# Patient Record
Sex: Female | Born: 1965 | Race: Black or African American | Hispanic: No | Smoking: Current every day smoker
Health system: Southern US, Community
[De-identification: ages and names within clinical notes are randomized; demographics above are authoritative.]

## PROBLEM LIST (undated history)

## (undated) DIAGNOSIS — F1010 Alcohol abuse, uncomplicated: Secondary | ICD-10-CM

## (undated) DIAGNOSIS — K219 Gastro-esophageal reflux disease without esophagitis: Secondary | ICD-10-CM

## (undated) DIAGNOSIS — F32A Depression, unspecified: Secondary | ICD-10-CM

## (undated) DIAGNOSIS — Z5900 Homelessness unspecified: Secondary | ICD-10-CM

## (undated) DIAGNOSIS — M109 Gout, unspecified: Secondary | ICD-10-CM

## (undated) DIAGNOSIS — I10 Essential (primary) hypertension: Secondary | ICD-10-CM

---

## 2001-10-16 ENCOUNTER — Emergency Department (HOSPITAL_COMMUNITY): Admission: EM | Admit: 2001-10-16 | Discharge: 2001-10-16 | Payer: Self-pay | Admitting: Emergency Medicine

## 2002-11-05 ENCOUNTER — Ambulatory Visit (HOSPITAL_COMMUNITY): Admission: RE | Admit: 2002-11-05 | Discharge: 2002-11-05 | Payer: Self-pay | Admitting: *Deleted

## 2002-11-05 ENCOUNTER — Encounter: Payer: Self-pay | Admitting: *Deleted

## 2002-12-21 ENCOUNTER — Inpatient Hospital Stay (HOSPITAL_COMMUNITY): Admission: AD | Admit: 2002-12-21 | Discharge: 2002-12-21 | Payer: Self-pay | Admitting: *Deleted

## 2003-02-09 ENCOUNTER — Inpatient Hospital Stay (HOSPITAL_COMMUNITY): Admission: AD | Admit: 2003-02-09 | Discharge: 2003-02-09 | Payer: Self-pay | Admitting: Obstetrics and Gynecology

## 2003-04-12 ENCOUNTER — Inpatient Hospital Stay (HOSPITAL_COMMUNITY): Admission: AD | Admit: 2003-04-12 | Discharge: 2003-04-12 | Payer: Self-pay | Admitting: *Deleted

## 2003-04-14 ENCOUNTER — Encounter (INDEPENDENT_AMBULATORY_CARE_PROVIDER_SITE_OTHER): Payer: Self-pay | Admitting: Specialist

## 2003-04-14 ENCOUNTER — Inpatient Hospital Stay (HOSPITAL_COMMUNITY): Admission: AD | Admit: 2003-04-14 | Discharge: 2003-04-16 | Payer: Self-pay | Admitting: Obstetrics & Gynecology

## 2003-04-19 ENCOUNTER — Inpatient Hospital Stay (HOSPITAL_COMMUNITY): Admission: AD | Admit: 2003-04-19 | Discharge: 2003-04-19 | Payer: Self-pay | Admitting: *Deleted

## 2005-04-24 ENCOUNTER — Emergency Department (HOSPITAL_COMMUNITY): Admission: EM | Admit: 2005-04-24 | Discharge: 2005-04-24 | Payer: Self-pay | Admitting: Emergency Medicine

## 2007-08-23 ENCOUNTER — Emergency Department (HOSPITAL_COMMUNITY): Admission: EM | Admit: 2007-08-23 | Discharge: 2007-08-23 | Payer: Self-pay | Admitting: Emergency Medicine

## 2009-06-24 ENCOUNTER — Emergency Department (HOSPITAL_COMMUNITY): Admission: EM | Admit: 2009-06-24 | Discharge: 2009-06-24 | Payer: Self-pay | Admitting: Emergency Medicine

## 2009-11-13 ENCOUNTER — Emergency Department (HOSPITAL_COMMUNITY): Admission: EM | Admit: 2009-11-13 | Discharge: 2009-11-13 | Payer: Self-pay | Admitting: Emergency Medicine

## 2010-12-06 LAB — URINALYSIS, ROUTINE W REFLEX MICROSCOPIC
Bilirubin Urine: NEGATIVE
Glucose, UA: NEGATIVE mg/dL
Hgb urine dipstick: NEGATIVE
Ketones, ur: NEGATIVE mg/dL
Nitrite: NEGATIVE
Protein, ur: NEGATIVE mg/dL
Specific Gravity, Urine: 1.022 (ref 1.005–1.030)
Urobilinogen, UA: 0.2 mg/dL (ref 0.0–1.0)
pH: 5.5 (ref 5.0–8.0)

## 2011-02-01 NOTE — Discharge Summary (Signed)
   NAME:  Amy Conway, Amy Conway NO.:  [id number]   MEDICAL RECORD NO.:  [id number]                   PATIENT TYPE:  INP   LOCATION:  9106                                 FACILITY:  [facility]   PHYSICIAN:  Burnadette Peter, M.D.             DATE OF BIRTH:  16-Jun-1966   DATE OF ADMISSION:  04/14/2003  DATE OF DISCHARGE:  04/16/2003                                 DISCHARGE SUMMARY   PRIMARY PHYSICIAN:  [facility].   DISCHARGE DIAGNOSES:  1. Status post repeat low transverse cesarean section.  2. Status post delivery of a viable female infant.   DISCHARGE MEDICATIONS:  1. Percocet 5/325 one to two tablets p.o. q.4-6h. p.r.n. severe pain.  2. Ibuprofen 600 mg p.o. q.6h. p.r.n. pain.  3. Prenatal vitamins one p.o. daily x6 weeks.  4. Iron sulfate 325 mg p.o. t.i.d. x6 weeks.  5. Alesse starting on Sunday, May 01, 2003; take one tablet p.o. daily.   FOLLOW-UP:  The patient is to follow up at either the GYN clinic in four  weeks if she desires a tubal ligation - the patient is to call to schedule  an appointment, or if she decides not to have a tubal ligation she is to  follow up at [facility] in six weeks.  Additionally the patient should  follow up at maternity admissions on Monday or Tuesday of this week for  staple removal.   ADMISSION HISTORY AND PHYSICAL:  A 45 year old G2 now P2 now admitted for  repeat low transverse C-section.  The patient did have LGA with gestational  diabetes.   HOSPITAL COURSE:  The patient tolerated the C-section without difficulty and  had a routine postpartum course and postoperative course.  She developed no  difficulties throughout her hospitalization and desired discharge on  postoperative day #2.   DISCHARGE LABORATORY DATA:  Wbc 8.6, hemoglobin 8.9, platelets 146, RPR  nonreactive.   Again, the patient is to follow up either at GYN clinic in four weeks or at  [facility] in six weeks.     Jonah Blue,  M.D.                      Burnadette Peter, M.D.    Milas Gain  D:  04/16/2003  T:  04/16/2003  Job:  [id number]   cc:   [facility]

## 2011-02-01 NOTE — Op Note (Signed)
NAME:  Amy Conway, Amy Conway NO.:  [id number]   MEDICAL RECORD NO.:  [id number]                   PATIENT TYPE:  INP   LOCATION:  9106                                 FACILITY:  [facility]   PHYSICIAN:  Clement Husbands, M.D.         DATE OF BIRTH:  1966-06-28   DATE OF PROCEDURE:  04/14/2003  DATE OF DISCHARGE:                                 OPERATIVE REPORT   PREOPERATIVE DIAGNOSIS:  Term pregnancy, repeat cesarean section.   POSTOPERATIVE DIAGNOSIS:  Term pregnancy, repeat cesarean section.   OPERATION:  Repeat low transverse cervical cesarean section.   SURGEON:  Burnadette Peter, M.D.   ANESTHESIA:  Spinal.   PROCEDURE:  With the patient under satisfactory spinal anesthesia in the  supine position and slightly tilted to the left, the urethra was prepped and  a Foley catheter inserted.  The abdomen was prepped and draped.  A low  abdominal transverse skin incision was made with the knife and cutting  cautery, and dissection was carried down through a very thick subcutaneous  tissue layer to the rectus fascia which was then sharply transversely  divided.  The rectus muscles were divided in the midline.  There were marked  adhesions under the upper rectus fascia and this required careful dissection  to free them up.  The peritoneum was then entered.  The bladder blade was  positioned.  The vesicouterine peritoneum was transversely incised and the  bladder pushed inferiorly and the bladder blade was repositioned.  A low  uterine transverse incision was made through an extremely thick myometrium.  I could not widen the incision with manual pulling, so bandage scissors were  utilized on each side to cut through the very thick myometrium.  The  peritoneal cavity was entered.  A very loose loop of nuchal cord was present  lying alongside the baby's head.  The vertex was lifted into the incision at  which  time the soft vacuum was applied.  With gentle  traction, the head was  easily delivered followed by the rest of the baby.  Spontaneous respirations  and crying were noted.  The cord was doubly clamped and divided.  The infant  was shown to the parents and passed on to the neonatal team that was in  attendance.  A long segment of cord was saved for both pH and routine  studies.  The placenta was adherent posteriorly but after some dissection,  it loosened up and was removed intact.  The uterus was explored and no  further tissue was noted to be present.  The internal os was dilated with a  sponge stick.  An intravenous Pitocin drip was started.  Intravenous Ancef  was given.   Although the uterus was involuting well, it was much too large to lift out  of the incision.  The uterine incision was closed with a running locking 0  Vicryl suture.  A second layer  imbricated the first.  There were numerous  bleeding sites.  Many figure-of-eight or single suturing with both 0 Vicryl  and a 2-0 Vicryl on a smaller needle were utilized to finally sustain  hemostasis.  The suture line was irrigated and hemostasis was good.  The  vesicouterine peritoneum was reapproximated with a running 2-0 Vicryl  sutures.  I then put my hand inside to go up to the top of the uterus to  find the fallopian tubes.  This caused the patient to have extreme pain and  she started crying and bearing down which brought small bowel into the  incision.  The small bowel was replaced in the upper abdomen.  I tried one  more time to bring the fundus of the uterus down somewhat and to locate the  tubes but this caused her to have extreme pain.  Because of this, I told the  patient that we would not tie her tubes and she was in complete agreement  on this.   The anterior peritoneum was closed with a running 0 Vicryl suture.  The area  underneath the fascia was inspected and there was no bleeding noted.  The  rectus muscles in the lower midline were put together with  interrupted 0  Vicryl sutures.  The rectus fascia was closed with two segments of running 0  Vicryl sutures, each begun laterally and brought to the midline where they  were tied separately.  The subcutaneous tissue layer was irrigated.  It was  inspected and there was no bleeding noted.  The skin edges were  reapproximated with wide skin staples.  Estimated blood loss 1000 mL.  Urine  output 120 mL.  Fluid intake 3500 mL.  Sponge and needle counts were  correct.  The patient tolerated the procedure well and was returned to the  recovery room in satisfactory condition.   She was delivered of a female infant who weighed 7 pounds.  The Apgar was 9  and 9.  The arterial cord pH was 7.3.                                               Clement Husbands, M.D.    EFR/MEDQ  D:  04/14/2003  T:  04/14/2003  Job:  [id number]

## 2011-09-13 ENCOUNTER — Emergency Department (HOSPITAL_COMMUNITY): Payer: Medicaid Other

## 2011-09-13 ENCOUNTER — Encounter: Payer: Self-pay | Admitting: Emergency Medicine

## 2011-09-13 ENCOUNTER — Emergency Department (HOSPITAL_COMMUNITY)
Admission: EM | Admit: 2011-09-13 | Discharge: 2011-09-13 | Disposition: A | Discharge status: Home / Self Care | Payer: Medicaid Other | Attending: Emergency Medicine | Admitting: Emergency Medicine

## 2011-09-13 DIAGNOSIS — F458 Other somatoform disorders: Secondary | ICD-10-CM | POA: Insufficient documentation

## 2011-09-13 DIAGNOSIS — R0989 Other specified symptoms and signs involving the circulatory and respiratory systems: Secondary | ICD-10-CM

## 2011-09-13 DIAGNOSIS — R6889 Other general symptoms and signs: Secondary | ICD-10-CM | POA: Insufficient documentation

## 2011-09-13 MED ORDER — GI COCKTAIL ~~LOC~~
30.0000 mL | Freq: Once | ORAL | Status: AC
  Administered 2011-09-13: 30 mL via ORAL
  Filled 2011-09-13: qty 30

## 2011-09-13 NOTE — ED Notes (Signed)
Pt to ED with having difficulty swallowing a piece of steak. Pt states that she got choked and felt like the steak would not pass through. Pt states the steak does not feel like its still stuck but she is having pain from where the steak was stuck

## 2011-09-13 NOTE — ED Notes (Signed)
ION:[id number]<MW> Expected date:09/13/11<BR> Expected time: 7:50 PM<BR> Means of arrival:Ambulance<BR> Comments:<BR> M251 - 45yoF Got choked up on food, no distress now

## 2011-09-13 NOTE — ED Provider Notes (Signed)
History     CSN: [id number]  Arrival date & time 09/13/11  4098   First MD Initiated Contact with Patient 09/13/11 2040      Chief Complaint  Patient presents with  . Sore Throat    after having some difficulty swallowing a piece of steak.      HPI  History provided by the patient. Patient is a 45 year old female with no significant past medical history who presents with complaints of feeling pressure and tightness in throat after swallowing a piece of steak earlier today. Patient states that she felt like the steak was stuck in her throat and was having some difficulty breathing. At this time patient states she is breathing fine. He has not had any nausea or vomiting. She denies any coughing. She states she still feels a pressure in her throat. Patient denies any similar symptoms previously. Patient has no other significant past medical history.     Past Medical History  Diagnosis Date  . No pertinent past medical history     Past Surgical History  Procedure Date  . Cesarean section     No family history on file.  History  Substance Use Topics  . Smoking status: Not on file  . Smokeless tobacco: Not on file  . Alcohol Use: Yes     2 40 oz weekly    OB History    Grav Para Term Preterm Abortions TAB SAB Ect Mult Living                  Review of Systems  Respiratory: Negative for cough.   Gastrointestinal: Negative for nausea, vomiting and abdominal pain.  All other systems reviewed and are negative.    Allergies  Penicillins  Home Medications   Current Outpatient Rx  Name Route Sig Dispense Refill  . POLYETHYL GLYCOL-PROPYL GLYCOL 0.4-0.3 % OP SOLN Both Eyes Place 2 drops into both eyes 2 (two) times daily.        BP 134/83  Pulse 97  Temp(Src) 98.1 F (36.7 C) (Oral)  Resp 22  SpO2 99%  LMP 08/29/2011  Physical Exam  Nursing note and vitals reviewed. Constitutional: She is oriented to person, place, and time. She appears well-developed and  well-nourished. No distress.  HENT:  Head: Normocephalic.  Mouth/Throat: Oropharynx is clear and moist.  Neck: Normal range of motion. Neck supple.       No crepitus or mass.  Cardiovascular: Normal rate and regular rhythm.   Pulmonary/Chest: Effort normal and breath sounds normal. No stridor. No respiratory distress. She has no rales. She exhibits no tenderness.  Abdominal: Soft. She exhibits no distension. There is no tenderness. There is no rebound and no guarding.  Lymphadenopathy:    She has no cervical adenopathy.  Neurological: She is alert and oriented to person, place, and time.  Skin: Skin is warm and dry. No rash noted.  Psychiatric: She has a normal mood and affect. Her behavior is normal.    ED Course  Procedures (including critical care time)  Labs Reviewed - No data to display Dg Chest 2 View  09/13/2011  *RADIOLOGY REPORT*  Clinical Data: Rule out foreign body from swallowing  CHEST - 2 VIEW  Comparison: None  Findings: Cardiomediastinal silhouette is unremarkable.  No acute infiltrate or pleural effusion.  No radiopaque foreign body is identified.  No pulmonary edema.  Mild degenerative changes mid thoracic spine.  IMPRESSION: No active disease.  No radiopaque foreign body is identified.  Original Report  Authenticated By: Natasha Mead, M.D.     1. Globus sensation       MDM  8:50 PM patient seen and evaluated. Patient in no acute distress. Patient handling secretions well.   Patient given several by mouth challenges with no difficulty in swallowing. Patient continues to have normal respirations and good O2 saturations. There is no stridor or abnormal lung sounds on exam. Patient's chest x-ray appears normal. At this time will discharge patient home with referral for GI specialist.     Angus Seller, PA 09/14/11 [id number]

## 2011-09-13 NOTE — ED Notes (Signed)
Pt given discharge instructions and verbalizes understanding  

## 2011-09-14 NOTE — ED Provider Notes (Signed)
Medical screening examination/treatment/procedure(s) were performed by non-physician practitioner and as supervising physician I was immediately available for consultation/collaboration.  Jullia Mulligan R. Quanisha Drewry, MD 09/14/11 1443 

## 2012-05-20 ENCOUNTER — Emergency Department (HOSPITAL_COMMUNITY)
Admission: EM | Admit: 2012-05-20 | Discharge: 2012-05-21 | Disposition: A | Discharge status: Home / Self Care | Payer: Medicaid Other | Attending: Emergency Medicine | Admitting: Emergency Medicine

## 2012-05-20 ENCOUNTER — Encounter (HOSPITAL_COMMUNITY): Payer: Self-pay | Admitting: Emergency Medicine

## 2012-05-20 DIAGNOSIS — N644 Mastodynia: Secondary | ICD-10-CM | POA: Insufficient documentation

## 2012-05-20 DIAGNOSIS — Z880 Allergy status to penicillin: Secondary | ICD-10-CM | POA: Insufficient documentation

## 2012-05-20 NOTE — ED Notes (Signed)
Per EMS, Pt from home with rt side breast pain.  Sharp pain radiating down right arm.  Onset 2 months.  Vitals:  170/100, pulse 104.  No visible bruising.  Tenderness noted.  No Hx, No meds reported.  Allergies to PCN.

## 2012-05-21 ENCOUNTER — Emergency Department (HOSPITAL_COMMUNITY): Payer: Medicaid Other

## 2012-05-21 MED ORDER — OXYCODONE-ACETAMINOPHEN 5-325 MG PO TABS
1.0000 | ORAL_TABLET | Freq: Once | ORAL | Status: AC
  Administered 2012-05-21: 1 via ORAL
  Filled 2012-05-21: qty 1

## 2012-05-21 MED ORDER — IBUPROFEN 800 MG PO TABS
800.0000 mg | ORAL_TABLET | Freq: Once | ORAL | Status: AC
  Administered 2012-05-21: 800 mg via ORAL
  Filled 2012-05-21: qty 1

## 2012-05-21 MED ORDER — IBUPROFEN 800 MG PO TABS: 800.0000 mg | ORAL_TABLET | Freq: Four times a day (QID) | ORAL | Status: DC | PRN

## 2012-05-21 NOTE — ED Provider Notes (Signed)
Medical screening examination/treatment/procedure(s) were performed by non-physician practitioner and as supervising physician I was immediately available for consultation/collaboration.  Olivia Mackie, MD 05/21/12 2137

## 2012-05-21 NOTE — ED Provider Notes (Signed)
History     CSN: [id number]  Arrival date & time 05/20/12  2100   First MD Initiated Contact with Patient 05/21/12 0214      Chief Complaint  Patient presents with  . Breast Pain    (Consider location/radiation/quality/duration/timing/severity/associated sxs/prior treatment) The history is provided by the patient and the spouse. No language interpreter was used.   Cc: 46 yo female.Patient reports R breast pain x 1 month.  States that it is a throbbing pain.  States that air conditioning makes it worse. No birthcontrol pills. No cough, SOB ,  No long trips or calf pain. LMP 04/29/12.   Smoker.  Has taken one goody powder for pain with minor relief.  Denies family hx of breast cancer.  States she does self exams and no detectible lumps.    Past Medical History  Diagnosis Date  . No pertinent past medical history     Past Surgical History  Procedure Date  . Cesarean section     No family history on file.  History  Substance Use Topics  . Smoking status: Not on file  . Smokeless tobacco: Not on file  . Alcohol Use: Yes     2 40 oz weekly    OB History    Grav Para Term Preterm Abortions TAB SAB Ect Mult Living                  Review of Systems  Constitutional: Negative.   HENT: Negative.   Eyes: Negative.   Respiratory: Negative.   Cardiovascular: Negative.        R breast pain  Gastrointestinal: Negative.  Negative for nausea and vomiting.  Genitourinary: Negative for menstrual problem.  Musculoskeletal: Negative for back pain.  Neurological: Negative.   Psychiatric/Behavioral: Negative.   All other systems reviewed and are negative.    Allergies  Penicillins  Home Medications   Current Outpatient Rx  Name Route Sig Dispense Refill  . ADULT MULTIVITAMIN W/MINERALS CH Oral Take 1 tablet by mouth daily.      BP 132/68  Pulse 71  Temp 98.2 F (36.8 C) (Oral)  Resp 20  Wt 172 lb (78.019 kg)  SpO2 100%  Physical Exam  Nursing note and vitals  reviewed. Constitutional: She is oriented to person, place, and time. She appears well-developed and well-nourished.  HENT:  Head: Normocephalic and atraumatic.  Eyes: Conjunctivae and EOM are normal. Pupils are equal, round, and reactive to light.  Neck: Normal range of motion. Neck supple.  Cardiovascular: Normal rate.   Pulmonary/Chest: Effort normal and breath sounds normal. No respiratory distress. She exhibits tenderness.       R breast cool to touch  Abdominal: Soft. Bowel sounds are normal.  Musculoskeletal: Normal range of motion. She exhibits no edema and no tenderness.  Neurological: She is alert and oriented to person, place, and time. She has normal reflexes.  Skin: Skin is warm and dry.  Psychiatric: She has a normal mood and affect.    ED Course  Procedures (including critical care time) Breast exam with no palpable lumps.  Chest x-ray unremarkable. Labs Reviewed - No data to display No results found.   No diagnosis found.    MDM  R breast tenderness.  Chest x-ray unremarkable.  No palpable lumps or lymphedema  She will [facility] and get [facility] scheduled this week.  Return to ER for severe pain, fever.  Remi Haggard, NP 05/21/12 1658

## 2012-05-23 ENCOUNTER — Emergency Department (HOSPITAL_COMMUNITY)
Admission: EM | Admit: 2012-05-23 | Discharge: 2012-05-23 | Disposition: A | Discharge status: Home / Self Care | Payer: Medicaid Other | Attending: Emergency Medicine | Admitting: Emergency Medicine

## 2012-05-23 ENCOUNTER — Encounter (HOSPITAL_COMMUNITY): Payer: Self-pay | Admitting: *Deleted

## 2012-05-23 DIAGNOSIS — N644 Mastodynia: Secondary | ICD-10-CM | POA: Insufficient documentation

## 2012-05-23 DIAGNOSIS — Z880 Allergy status to penicillin: Secondary | ICD-10-CM | POA: Insufficient documentation

## 2012-05-23 MED ORDER — TRAMADOL HCL 50 MG PO TABS: 50.0000 mg | ORAL_TABLET | Freq: Four times a day (QID) | ORAL | Status: AC | PRN

## 2012-05-23 NOTE — ED Provider Notes (Signed)
History     CSN: [id number]  Arrival date & time 05/23/12  2155   First MD Initiated Contact with Patient 05/23/12 2304      Chief Complaint  Patient presents with  . rt breast pain     (Consider location/radiation/quality/duration/timing/severity/associated sxs/prior treatment) HPI Comments: Patient with persistent right breast pain since September 4.  She was evaluated at that time.  Referred to the breast clinic for mammogram, which she has not had as of yet.  She has not made an appointment with her OB/GYN physician.  Either, she, states she was taking ibuprofen, but it causes her to itch.  She comes in tonight to to the pain.  No new injury, and abrasion of skin.  No rash no shortness of breath.  No fever  The history is provided by the patient.    Past Medical History  Diagnosis Date  . No pertinent past medical history     Past Surgical History  Procedure Date  . Cesarean section     No family history on file.  History  Substance Use Topics  . Smoking status: Not on file  . Smokeless tobacco: Not on file  . Alcohol Use: Yes     2 40 oz weekly    OB History    Grav Para Term Preterm Abortions TAB SAB Ect Mult Living                  Review of Systems  Constitutional: Negative for fever and chills.  Respiratory: Negative for shortness of breath.   Cardiovascular: Negative for chest pain.  Musculoskeletal: Negative for back pain.  Skin: Negative for rash and wound.  Neurological: Negative for dizziness.    Allergies  Ibuprofen and Penicillins  Home Medications   Current Outpatient Rx  Name Route Sig Dispense Refill  . TRAMADOL HCL 50 MG PO TABS Oral Take 1 tablet (50 mg total) by mouth every 6 (six) hours as needed for pain. 15 tablet 0    BP 144/99  Pulse 77  Temp 98.3 F (36.8 C) (Oral)  Resp 16  SpO2 99%  LMP 04/29/2012  Physical Exam  Constitutional: She appears well-developed and well-nourished.  HENT:  Head: Normocephalic.  Eyes:  Pupils are equal, round, and reactive to light.  Neck: Normal range of motion.  Cardiovascular: Normal rate.   Pulmonary/Chest: Effort normal. She exhibits no mass, no tenderness and no edema. Right breast exhibits tenderness. Right breast exhibits no inverted nipple, no nipple discharge and no skin change.      ED Course  Procedures (including critical care time)  Labs Reviewed - No data to display No results found.   1. Breast pain       MDM  I find no change in physical exam from previously stated recommended.  The patient.  Followup with the breast clinic for mammogram.  Her OB/GYN for results.  I will prescribe Ultram for her discomfort        Arman Filter, NP 05/23/12 2317  Arman Filter, NP 05/23/12 2318

## 2012-05-23 NOTE — ED Notes (Signed)
The pt is here for rt breast pain since September 4th and she has pain down her rt arm.  She was seen at [facility] long ed at that time and she was told to have a mammogram done.  She is here coming in by ems to be seen for the same because she does not feel like she received the correct treatement.  She did not get the rxs filled because she thought she was allergic to the med,  Smells of alcohol

## 2012-05-24 NOTE — ED Provider Notes (Signed)
Medical screening examination/treatment/procedure(s) were performed by non-physician practitioner and as supervising physician I was immediately available for consultation/collaboration.  Flint Melter, MD 05/24/12 2004

## 2012-10-27 ENCOUNTER — Emergency Department (HOSPITAL_COMMUNITY)
Admission: EM | Admit: 2012-10-27 | Discharge: 2012-10-27 | Disposition: A | Discharge status: Home / Self Care | Payer: Medicaid Other | Attending: Emergency Medicine | Admitting: Emergency Medicine

## 2012-10-27 ENCOUNTER — Encounter (HOSPITAL_COMMUNITY): Payer: Self-pay | Admitting: *Deleted

## 2012-10-27 DIAGNOSIS — K0889 Other specified disorders of teeth and supporting structures: Secondary | ICD-10-CM

## 2012-10-27 DIAGNOSIS — K089 Disorder of teeth and supporting structures, unspecified: Secondary | ICD-10-CM | POA: Insufficient documentation

## 2012-10-27 MED ORDER — CLINDAMYCIN HCL 150 MG PO CAPS: 300.0000 mg | ORAL_CAPSULE | Freq: Three times a day (TID) | ORAL | Status: DC

## 2012-10-27 MED ORDER — HYDROCODONE-ACETAMINOPHEN 5-325 MG PO TABS: 2.0000 | ORAL_TABLET | Freq: Four times a day (QID) | ORAL | Status: DC | PRN

## 2012-10-27 MED ORDER — BUPIVACAINE HCL (PF) 0.25 % IJ SOLN: 5.0000 mL | Freq: Once | INTRAMUSCULAR | Status: DC

## 2012-10-27 MED ORDER — BUPIVACAINE-EPINEPHRINE PF 0.5-1:200000 % IJ SOLN
INTRAMUSCULAR | Status: AC
  Filled 2012-10-27: qty 1.8

## 2012-10-27 NOTE — ED Notes (Signed)
EMS reports mouth pain for the last year, no defined description, c/o headache

## 2012-10-27 NOTE — ED Provider Notes (Signed)
History  This chart was scribed for non-physician practitioner working with Loren Racer, MD by Candelaria Stagers, ED Scribe. This patient was seen in room WTR7/WTR7 and the patient's care was started at 10:47 PM   CSN: [id number]  Arrival date & time 10/27/12  2223   First MD Initiated Contact with Patient 10/27/12 2240      Chief Complaint  Patient presents with  . Dental Pain     The history is provided by the patient. No language interpreter was used.   Amy Conway is a 47 y.o. female who presents to the Emergency Department complaining of dental pain, worse on the lower left side, that started over the last year and has became worse tonight.  She has taken [facility] powder with no relief. Nothing makes pain better or worse. Pain radiates to her jaw. She states the pain is moderate to severe. Pt reports that she is not able to see a dentist. Patient denies chest pain, shortness of breath, nausea, vomiting, abdominal pain, diarrhea, constipation, numbness or tingling of the extremities.   Past Medical History  Diagnosis Date  . No pertinent past medical history     Past Surgical History  Procedure Laterality Date  . Cesarean section      No family history on file.  History  Substance Use Topics  . Smoking status: Not on file  . Smokeless tobacco: Not on file  . Alcohol Use: Yes     Comment: 2 40 oz weekly    OB History   Grav Para Term Preterm Abortions TAB SAB Ect Mult Living                  Review of Systems  Constitutional: Negative for fever.  HENT: Positive for dental problem.   All other systems reviewed and are negative.    Allergies  Ibuprofen and Penicillins  Home Medications   Current Outpatient Rx  Name  Route  Sig  Dispense  Refill  . acetaminophen (TYLENOL) 500 MG tablet   Oral   Take 1,000 mg by mouth every 6 (six) hours as needed for pain.         . Aspirin-Salicylamide-Caffeine (BC HEADACHE POWDER PO)   Oral   Take 1 Package by  mouth daily.           BP 145/83  Pulse 113  Temp(Src) 98.4 F (36.9 C) (Oral)  Resp 20  Ht 5\' 1"  (1.549 m)  Wt 171 lb 9.6 oz (77.837 kg)  BMI 32.44 kg/m2  SpO2 95%  LMP 08/15/2011  Physical Exam  Nursing note and vitals reviewed. Constitutional: She is oriented to person, place, and time. She appears well-developed and well-nourished. No distress.  HENT:  Head: Normocephalic and atraumatic.  Mouth/Throat:    Poor dentition throughout.  Cracked and broken teeth throughout.  No signs of gingival abscess.  No signs of tonsillar or peritonsillar abscess.  Uvula midline.  Airways intact.    Eyes: EOM are normal.  Neck: Neck supple. No tracheal deviation present.  Cardiovascular:  Mildly tachycardic  Pulmonary/Chest: Effort normal. No respiratory distress.  Musculoskeletal: Normal range of motion.  Neurological: She is alert and oriented to person, place, and time.  Skin: Skin is warm and dry.  Psychiatric: She has a normal mood and affect. Her behavior is normal.    ED Course  Procedures   DIAGNOSTIC STUDIES: Oxygen Saturation is 95% on room air, normal by my interpretation.    COORDINATION OF CARE:  10:39  PM Ordered: Pregnancy, urine  10:49 PM Will give pain medication and antibiotic.  Advised tp  11:00 PM Ordered: bupivacaine (Marcain) 0.25% (with pres) injection 5 mL 11:03 PM Dental block performed by Roxy Horseman, PA-C to lower molars bilaterally with Marcain 0.25%.  Pt tolerated the procedure with no problems.  11:07 PM Instructed pt not to take pain medication with alcohol.  Pt agrees to take medication as directed.    Labs Reviewed  PREGNANCY, URINE   No results found.   1. Pain, dental       MDM  47 year old female with uncomplicated dental pain. Patient will followup with a dentist, she agrees to take her medications as prescribed. She is stable and ready for discharge. Will give clindamycin as patient is allergic to penicillin.   I personally  performed the services described in this documentation, which was scribed in my presence. The recorded information has been reviewed and is accurate.        Roxy Horseman, PA-C 10/27/12 2333

## 2012-10-27 NOTE — ED Notes (Signed)
Pt c/o having bad teeth that need to be pulled; mouth pain not relieved with alka seltzer

## 2012-10-27 NOTE — ED Provider Notes (Signed)
Medical screening examination/treatment/procedure(s) were performed by non-physician practitioner and as supervising physician I was immediately available for consultation/collaboration.   Dedee Liss, MD 10/27/12 2350 

## 2013-05-27 ENCOUNTER — Emergency Department (HOSPITAL_COMMUNITY)
Admission: EM | Admit: 2013-05-27 | Discharge: 2013-05-28 | Disposition: A | Discharge status: Home / Self Care | Payer: Medicaid Other | Attending: Emergency Medicine | Admitting: Emergency Medicine

## 2013-05-27 DIAGNOSIS — I10 Essential (primary) hypertension: Secondary | ICD-10-CM | POA: Insufficient documentation

## 2013-05-27 DIAGNOSIS — R071 Chest pain on breathing: Secondary | ICD-10-CM | POA: Insufficient documentation

## 2013-05-27 DIAGNOSIS — Z3202 Encounter for pregnancy test, result negative: Secondary | ICD-10-CM | POA: Insufficient documentation

## 2013-05-27 LAB — CBC
MCH: 23.7 pg — ABNORMAL LOW (ref 26.0–34.0)
MCHC: 32.2 g/dL (ref 30.0–36.0)
MCV: 73.6 fL — ABNORMAL LOW (ref 78.0–100.0)
Platelets: 242 10*3/uL (ref 150–400)
RDW: 15.2 % (ref 11.5–15.5)
WBC: 5.9 10*3/uL (ref 4.0–10.5)

## 2013-05-27 LAB — BASIC METABOLIC PANEL
Calcium: 9.1 mg/dL (ref 8.4–10.5)
Creatinine, Ser: 0.72 mg/dL (ref 0.50–1.10)
GFR calc Af Amer: >90 mL/min (ref >90)

## 2013-05-27 LAB — POCT I-STAT TROPONIN I: Troponin i, poc: 0.01 ng/mL (ref 0.00–0.08)

## 2013-05-27 NOTE — ED Notes (Signed)
Pt reports that she has had hypertension for the past week with chest pain. Pt reports BP 205/134 this morning when attempting to donate plasma

## 2013-05-27 NOTE — ED Notes (Signed)
Per EMS, pt reports that she has been having hypertension and has been unable to donate plasma d/t this.  Pt was unable to follow up with her PCP, so she called EMS to be evaluated by the ED.

## 2013-05-28 ENCOUNTER — Emergency Department (HOSPITAL_COMMUNITY): Payer: Medicaid Other

## 2013-05-28 MED ORDER — HYDROCHLOROTHIAZIDE 25 MG PO TABS: 25.0000 mg | ORAL_TABLET | Freq: Every day | ORAL | Status: DC

## 2013-05-28 NOTE — ED Notes (Signed)
Patient is alert and oriented x3.  She was given DC instructions and follow up visit instructions.  Patient gave verbal understanding. She was DC ambulatory under her own power to home.  V/S stable.  He was not showing any signs of distress on DC 

## 2013-05-28 NOTE — ED Provider Notes (Signed)
CSN: [id number]     Arrival date & time 05/27/13  2032 History   First MD Initiated Contact with Patient 05/28/13 0047     Chief Complaint  Patient presents with  . Hypertension  . Chest Pain   (Consider location/radiation/quality/duration/timing/severity/associated sxs/prior Treatment) Patient is a 47 y.o. female presenting with hypertension and chest pain. The history is provided by the patient.  Hypertension Associated symptoms include chest pain.  Chest Pain pt here due to increased blood pressure found today when she went to donate plasma--bp was 200/150. She denied any severe HA, sob, or weakness. No prior h/o htn. Has had pin-point left sided chest pain worse with movement x 4 days without associated angina sx of dyspnea/exertional compnent/diaphoresis. No fever or cough. She called ems and was transported here due to that reason.  Past Medical History  Diagnosis Date  . No pertinent past medical history    Past Surgical History  Procedure Laterality Date  . Cesarean section     No family history on file. History  Substance Use Topics  . Smoking status: Not on file  . Smokeless tobacco: Not on file  . Alcohol Use: Yes     Comment: 2 40 oz weekly   OB History   Grav Para Term Preterm Abortions TAB SAB Ect Mult Living                 Review of Systems  Cardiovascular: Positive for chest pain.  All other systems reviewed and are negative.    Allergies  Ibuprofen and Penicillins  Home Medications   Current Outpatient Rx  Name  Route  Sig  Dispense  Refill  . naproxen sodium (ANAPROX) 220 MG tablet   Oral   Take 220 mg by mouth 2 (two) times daily with a meal.          BP 147/98  Pulse 83  Temp(Src) 98.1 F (36.7 C) (Oral)  Resp 20  SpO2 100%  LMP 03/26/2013 Physical Exam  Nursing note and vitals reviewed. Constitutional: She is oriented to person, place, and time. She appears well-developed and well-nourished.  Non-toxic appearance. No distress.    HENT:  Head: Normocephalic and atraumatic.  Eyes: Conjunctivae, EOM and lids are normal. Pupils are equal, round, and reactive to light.  Neck: Normal range of motion. Neck supple. No tracheal deviation present. No mass present.  Cardiovascular: Normal rate, regular rhythm and normal heart sounds.  Exam reveals no gallop.   No murmur heard. Pulmonary/Chest: Effort normal. No stridor. No respiratory distress. She has no decreased breath sounds. She has no wheezes. She has no rhonchi. She has no rales.    Abdominal: Soft. Normal appearance and bowel sounds are normal. She exhibits no distension. There is no tenderness. There is no rebound and no CVA tenderness.  Musculoskeletal: Normal range of motion. She exhibits no edema and no tenderness.  Neurological: She is alert and oriented to person, place, and time. She has normal strength. No cranial nerve deficit or sensory deficit. GCS eye subscore is 4. GCS verbal subscore is 5. GCS motor subscore is 6.  Skin: Skin is warm and dry. No abrasion and no rash noted.  Psychiatric: She has a normal mood and affect. Her speech is normal and behavior is normal.    ED Course  Procedures (including critical care time) Labs Review Labs Reviewed  CBC - Abnormal; Notable for the following:    RBC 5.35 (*)    MCV 73.6 (*)  MCH 23.7 (*)    All other components within normal limits  BASIC METABOLIC PANEL  POCT I-STAT TROPONIN I   Imaging Review No results found.  MDM  No diagnosis found.  Date: 05/28/2013  Rate: 75  Rhythm: normal sinus rhythm  QRS Axis: normal  Intervals: normal  ST/T Wave abnormalities: early repolarization  Conduction Disutrbances:none  Narrative Interpretation:   Old EKG Reviewed: none available    Patient's blood pressure has been stable here. Her symptoms are not worrisome for ACS or PE. We'll treat patient's hypertension with diuretics and she will follow with her Dr.  Toy Baker, MD 05/28/13 [phone]

## 2013-06-23 ENCOUNTER — Encounter (HOSPITAL_COMMUNITY): Payer: Self-pay | Admitting: Emergency Medicine

## 2013-06-23 ENCOUNTER — Emergency Department (HOSPITAL_COMMUNITY): Payer: Medicaid Other

## 2013-06-23 ENCOUNTER — Emergency Department (HOSPITAL_COMMUNITY)
Admission: EM | Admit: 2013-06-23 | Discharge: 2013-06-23 | Disposition: A | Discharge status: Home / Self Care | Payer: Medicaid Other | Attending: Emergency Medicine | Admitting: Emergency Medicine

## 2013-06-23 DIAGNOSIS — Z880 Allergy status to penicillin: Secondary | ICD-10-CM | POA: Insufficient documentation

## 2013-06-23 DIAGNOSIS — J069 Acute upper respiratory infection, unspecified: Secondary | ICD-10-CM | POA: Diagnosis present

## 2013-06-23 DIAGNOSIS — I10 Essential (primary) hypertension: Secondary | ICD-10-CM | POA: Insufficient documentation

## 2013-06-23 DIAGNOSIS — Z79899 Other long term (current) drug therapy: Secondary | ICD-10-CM | POA: Insufficient documentation

## 2013-06-23 DIAGNOSIS — R071 Chest pain on breathing: Secondary | ICD-10-CM | POA: Insufficient documentation

## 2013-06-23 HISTORY — DX: Essential (primary) hypertension: I10

## 2013-06-23 MED ORDER — HYDROCOD POLST-CHLORPHEN POLST 10-8 MG/5ML PO LQCR: 5.0000 mL | Freq: Every evening | ORAL | Status: DC | PRN

## 2013-06-23 NOTE — ED Notes (Addendum)
Pt has had productive cough x 1 week, yellow sputum. Chest wall pain from coughing. States she threw up 3 times tonight from the coughing.

## 2013-06-23 NOTE — ED Notes (Signed)
Bed: AO13 Expected date:  Expected time:  Means of arrival:  Comments: EMS chest wall pain, prod cough

## 2013-06-23 NOTE — ED Provider Notes (Signed)
CSN: [id number]     Arrival date & time 06/23/13  2157 History   First MD Initiated Contact with Patient 06/23/13 2208     Chief Complaint  Patient presents with  . Cough   (Consider location/radiation/quality/duration/timing/severity/associated sxs/prior Treatment) Patient is a 47 y.o. female presenting with cough. The history is provided by the patient.  Cough Cough characteristics:  Productive Sputum characteristics:  Yellow Severity:  Mild Onset quality:  Gradual Duration:  1 week Timing:  Constant Progression:  Unchanged Chronicity:  New Relieved by:  Nothing Worsened by:  Nothing tried Ineffective treatments:  None tried Associated symptoms: chest pain (only with cough) and rhinorrhea   Associated symptoms: no fever, no headaches and no shortness of breath     Past Medical History  Diagnosis Date  . No pertinent past medical history   . Hypertension    Past Surgical History  Procedure Laterality Date  . Cesarean section     No family history on file. History  Substance Use Topics  . Smoking status: Not on file  . Smokeless tobacco: Not on file  . Alcohol Use: Yes     Comment: 2 40 oz weekly   OB History   Grav Para Term Preterm Abortions TAB SAB Ect Mult Living                 Review of Systems  Constitutional: Negative for fever and fatigue.  HENT: Positive for congestion and rhinorrhea. Negative for drooling.   Eyes: Negative for pain.  Respiratory: Positive for cough. Negative for shortness of breath.   Cardiovascular: Positive for chest pain (only with cough).  Gastrointestinal: Negative for nausea, vomiting, abdominal pain and diarrhea.  Genitourinary: Negative for dysuria and hematuria.  Musculoskeletal: Negative for back pain, gait problem and neck pain.  Skin: Negative for color change.  Neurological: Negative for dizziness and headaches.  Hematological: Negative for adenopathy.  Psychiatric/Behavioral: Negative for behavioral problems.  All  other systems reviewed and are negative.    Allergies  Ibuprofen and Penicillins  Home Medications   Current Outpatient Rx  Name  Route  Sig  Dispense  Refill  . DM-Doxylamine-Acetaminophen (NYQUIL COLD & FLU PO)   Oral   Take 1-2 capsules by mouth at bedtime as needed (flu-like symptoms).         . hydrochlorothiazide (HYDRODIURIL) 25 MG tablet   Oral   Take 1 tablet (25 mg total) by mouth daily.   60 tablet   0    BP 125/82  Pulse 89  Temp(Src) 97.8 F (36.6 C) (Oral)  Resp 22  SpO2 100%  LMP 06/11/2013 Physical Exam  Nursing note and vitals reviewed. Constitutional: She is oriented to person, place, and time. She appears well-developed and well-nourished.  HENT:  Head: Normocephalic.  Mouth/Throat: Oropharynx is clear and moist. No oropharyngeal exudate.  Eyes: Conjunctivae and EOM are normal. Pupils are equal, round, and reactive to light.  Neck: Normal range of motion. Neck supple.  Cardiovascular: Normal rate, regular rhythm, normal heart sounds and intact distal pulses.  Exam reveals no gallop and no friction rub.   No murmur heard. Pulmonary/Chest: Effort normal and breath sounds normal. No respiratory distress. She has no wheezes.  Abdominal: Soft. Bowel sounds are normal. There is no tenderness. There is no rebound and no guarding.  Musculoskeletal: Normal range of motion. She exhibits no edema and no tenderness.  Neurological: She is alert and oriented to person, place, and time.  Skin: Skin is warm and  dry.  Psychiatric: She has a normal mood and affect. Her behavior is normal.    ED Course  Procedures (including critical care time) Labs Review Labs Reviewed - No data to display Imaging Review Dg Chest 2 View  06/23/2013   *RADIOLOGY REPORT*  Clinical Data: Cough and chest pain.  CHEST - 2 VIEW  Comparison: Chest radiograph May 28, 2013.  Findings: The cardiomediastinal silhouette is unremarkable.  The lungs are clear without pleural effusions  or focal consolidations. The pulmonary vasculature is unremarkable.   Trachea projects midline and there is no pneumothorax.  The included soft tissue planes and osseous structures are unremarkable.  X-ray  IMPRESSION: No acute cardiopulmonary process; stable appearance of the chest from May 28, 2013.   Original Report Authenticated By: Awilda Metro    MDM   1. Viral URI    10:23 PM 47 y.o. female presents with a productive cough for one week. She notes her sputum is yellow. She denies any fevers. She is afebrile and vital signs are unremarkable here. She notes that she has chest wall pain with coughing. Will get chest x-ray to rule out pneumonia.  11:19 PM: I interpreted/reviewed the labs and/or imaging which were non-contributory.  I have discussed the diagnosis/risks/treatment options with the patient and believe the pt to be eligible for discharge home to follow-up with pcp as needed. We also discussed returning to the ED immediately if new or worsening sx occur. We discussed the sx which are most concerning (e.g., sob, worsening pain, fever) that necessitate immediate return. Any new prescriptions provided to the patient are listed below.  Discharge Medication List as of 06/23/2013 11:20 PM    START taking these medications   Details  chlorpheniramine-HYDROcodone (TUSSIONEX PENNKINETIC ER) 10-8 MG/5ML LQCR Take 5 mLs by mouth at bedtime as needed., Starting 06/23/2013, Until Discontinued, Print         Junius Argyle, MD 06/24/13 0028

## 2013-08-29 ENCOUNTER — Encounter (HOSPITAL_COMMUNITY): Payer: Self-pay | Admitting: Emergency Medicine

## 2013-08-29 ENCOUNTER — Emergency Department (HOSPITAL_COMMUNITY)
Admission: EM | Admit: 2013-08-29 | Discharge: 2013-08-29 | Disposition: A | Discharge status: Home / Self Care | Payer: Medicaid Other | Attending: Emergency Medicine | Admitting: Emergency Medicine

## 2013-08-29 DIAGNOSIS — I10 Essential (primary) hypertension: Secondary | ICD-10-CM | POA: Insufficient documentation

## 2013-08-29 DIAGNOSIS — Z880 Allergy status to penicillin: Secondary | ICD-10-CM | POA: Insufficient documentation

## 2013-08-29 DIAGNOSIS — Z7982 Long term (current) use of aspirin: Secondary | ICD-10-CM | POA: Insufficient documentation

## 2013-08-29 DIAGNOSIS — Z79899 Other long term (current) drug therapy: Secondary | ICD-10-CM | POA: Insufficient documentation

## 2013-08-29 DIAGNOSIS — K2970 Gastritis, unspecified, without bleeding: Secondary | ICD-10-CM

## 2013-08-29 DIAGNOSIS — K5289 Other specified noninfective gastroenteritis and colitis: Secondary | ICD-10-CM | POA: Insufficient documentation

## 2013-08-29 DIAGNOSIS — R51 Headache: Secondary | ICD-10-CM | POA: Insufficient documentation

## 2013-08-29 DIAGNOSIS — Z3202 Encounter for pregnancy test, result negative: Secondary | ICD-10-CM | POA: Insufficient documentation

## 2013-08-29 LAB — URINALYSIS, ROUTINE W REFLEX MICROSCOPIC
Ketones, ur: NEGATIVE mg/dL
Leukocytes, UA: NEGATIVE
Nitrite: NEGATIVE
Specific Gravity, Urine: 1.004 — ABNORMAL LOW (ref 1.005–1.030)
Urobilinogen, UA: 0.2 mg/dL (ref 0.0–1.0)
pH: 5 (ref 5.0–8.0)

## 2013-08-29 LAB — COMPREHENSIVE METABOLIC PANEL
ALT: 25 U/L (ref 0–35)
AST: 55 U/L — ABNORMAL HIGH (ref 0–37)
Albumin: 3.8 g/dL (ref 3.5–5.2)
Alkaline Phosphatase: 77 U/L (ref 39–117)
Calcium: 8.9 mg/dL (ref 8.4–10.5)
Chloride: 94 mEq/L — ABNORMAL LOW (ref 96–112)
Glucose, Bld: 86 mg/dL (ref 70–99)
Potassium: 3.5 mEq/L (ref 3.5–5.1)
Sodium: 135 mEq/L (ref 135–145)
Total Bilirubin: 0.4 mg/dL (ref 0.3–1.2)
Total Protein: 7.4 g/dL (ref 6.0–8.3)

## 2013-08-29 LAB — ETHANOL: Alcohol, Ethyl (B): 241 mg/dL — ABNORMAL HIGH (ref 0–11)

## 2013-08-29 LAB — CBC WITH DIFFERENTIAL/PLATELET
Basophils Absolute: 0 10*3/uL (ref 0.0–0.1)
Basophils Relative: 0 % (ref 0–1)
Eosinophils Absolute: 0.1 10*3/uL (ref 0.0–0.7)
HCT: 40.7 % (ref 36.0–46.0)
Lymphocytes Relative: 53 % — ABNORMAL HIGH (ref 12–46)
MCH: 24 pg — ABNORMAL LOW (ref 26.0–34.0)
Monocytes Absolute: 0.4 10*3/uL (ref 0.1–1.0)
Monocytes Relative: 6 % (ref 3–12)
Neutro Abs: 2.6 10*3/uL (ref 1.7–7.7)
Neutrophils Relative %: 39 % — ABNORMAL LOW (ref 43–77)
Platelets: 228 10*3/uL (ref 150–400)
RDW: 14.9 % (ref 11.5–15.5)
WBC: 6.6 10*3/uL (ref 4.0–10.5)

## 2013-08-29 LAB — POCT I-STAT, CHEM 8
Calcium, Ion: 1.08 mmol/L — ABNORMAL LOW (ref 1.12–1.23)
Chloride: 97 mEq/L (ref 96–112)
Glucose, Bld: 85 mg/dL (ref 70–99)
HCT: 47 % — ABNORMAL HIGH (ref 36.0–46.0)
Hemoglobin: 16 g/dL — ABNORMAL HIGH (ref 12.0–15.0)
Sodium: 137 mEq/L (ref 135–145)

## 2013-08-29 LAB — URINE MICROSCOPIC-ADD ON

## 2013-08-29 MED ORDER — ONDANSETRON 4 MG PO TBDP: 4.0000 mg | ORAL_TABLET | Freq: Three times a day (TID) | ORAL | Status: DC | PRN

## 2013-08-29 MED ORDER — OMEPRAZOLE 20 MG PO CPDR: DELAYED_RELEASE_CAPSULE | ORAL | Status: DC

## 2013-08-29 MED ORDER — ONDANSETRON HCL 4 MG/2ML IJ SOLN
4.0000 mg | Freq: Once | INTRAMUSCULAR | Status: AC
  Administered 2013-08-29: 4 mg via INTRAVENOUS
  Filled 2013-08-29: qty 2

## 2013-08-29 MED ORDER — SODIUM CHLORIDE 0.9 % IV BOLUS (SEPSIS)
1000.0000 mL | Freq: Once | INTRAVENOUS | Status: AC
  Administered 2013-08-29: 1000 mL via INTRAVENOUS

## 2013-08-29 MED ORDER — GI COCKTAIL ~~LOC~~
30.0000 mL | Freq: Once | ORAL | Status: AC
  Administered 2013-08-29: 30 mL via ORAL
  Filled 2013-08-29: qty 30

## 2013-08-29 NOTE — ED Provider Notes (Signed)
CSN: [id number]     Arrival date & time 08/29/13  1932 History   First MD Initiated Contact with Patient 08/29/13 1942     Chief Complaint  Patient presents with  . Abdominal Pain  . Emesis   (Consider location/radiation/quality/duration/timing/severity/associated sxs/prior Treatment) HPI Comments: Patient with history of alcoholism -- presents with complaint of intermittent abdominal pain over the past month. Pain has been intermittently located in different areas but is worse on the left side tonight in the upper part of the abdomen. It has been associated with intermittent vomiting. No fever, urinary symptoms. Patient has had frequent watery stools. She admits to drinking a 40oz a day. LMP 2 months ago, but history of irregular periods. No treatments PTA. The onset of this condition was acute. The course is constant. Aggravating factors: none. Alleviating factors: none.    Patient is a 47 y.o. female presenting with abdominal pain and vomiting. The history is provided by the patient.  Abdominal Pain Associated symptoms: diarrhea, nausea and vomiting   Associated symptoms: no chest pain, no cough, no dysuria, no fever, no sore throat, no vaginal bleeding and no vaginal discharge   Emesis Associated symptoms: abdominal pain, diarrhea and headaches   Associated symptoms: no myalgias and no sore throat     Past Medical History  Diagnosis Date  . No pertinent past medical history   . Hypertension    Past Surgical History  Procedure Laterality Date  . Cesarean section     No family history on file. History  Substance Use Topics  . Smoking status: Never Smoker   . Smokeless tobacco: Not on file  . Alcohol Use: Yes     Comment: 2 40 oz weekly   OB History   Grav Para Term Preterm Abortions TAB SAB Ect Mult Living                 Review of Systems  Constitutional: Negative for fever.  HENT: Negative for rhinorrhea and sore throat.   Eyes: Negative for redness.  Respiratory:  Negative for cough.   Cardiovascular: Negative for chest pain.  Gastrointestinal: Positive for nausea, vomiting, abdominal pain and diarrhea.  Genitourinary: Negative for dysuria, vaginal bleeding and vaginal discharge.  Musculoskeletal: Negative for myalgias.  Skin: Negative for rash.  Neurological: Positive for headaches.    Allergies  Ibuprofen and Penicillins  Home Medications   Current Outpatient Rx  Name  Route  Sig  Dispense  Refill  . Aspirin-Salicylamide-Caffeine (BC HEADACHE PO)   Oral   Take 1 packet by mouth daily.         Marland Kitchen aspirin-sod bicarb-citric acid (ALKA-SELTZER) 325 MG TBEF tablet   Oral   Take 325 mg by mouth every 6 (six) hours as needed.         . hydrochlorothiazide (HYDRODIURIL) 25 MG tablet   Oral   Take 1 tablet (25 mg total) by mouth daily.   60 tablet   0    BP 114/67  Pulse 75  Temp(Src) 97.8 F (36.6 C) (Oral)  Resp 18  SpO2 100%  LMP 06/29/2013 Physical Exam  Nursing note and vitals reviewed. Constitutional: She appears well-developed and well-nourished.  HENT:  Head: Normocephalic and atraumatic.  Eyes: Conjunctivae are normal. Right eye exhibits no discharge. Left eye exhibits no discharge.  Neck: Normal range of motion. Neck supple.  Cardiovascular: Normal rate, regular rhythm and normal heart sounds.   Pulmonary/Chest: Effort normal and breath sounds normal. No respiratory distress. She has no  wheezes. She has no rales.  Abdominal: Soft. There is tenderness (generalized tenderness, worse upper left) in the epigastric area and left upper quadrant. There is no rigidity, no rebound, no guarding, no CVA tenderness, no tenderness at McBurney's point and negative Murphy's sign.  Musculoskeletal: She exhibits no edema and no tenderness.  Neurological: She is alert.  Skin: Skin is warm and dry.  Psychiatric: She has a normal mood and affect.    ED Course  Procedures (including critical care time) Labs Review Labs Reviewed   URINALYSIS, ROUTINE W REFLEX MICROSCOPIC - Abnormal; Notable for the following:    Specific Gravity, Urine 1.004 (*)    Hgb urine dipstick SMALL (*)    All other components within normal limits  CBC WITH DIFFERENTIAL - Abnormal; Notable for the following:    RBC 5.62 (*)    MCV 72.4 (*)    MCH 24.0 (*)    Neutrophils Relative % 39 (*)    Lymphocytes Relative 53 (*)    All other components within normal limits  COMPREHENSIVE METABOLIC PANEL - Abnormal; Notable for the following:    Chloride 94 (*)    AST 55 (*)    All other components within normal limits  ETHANOL - Abnormal; Notable for the following:    Alcohol, Ethyl (B) 241 (*)    All other components within normal limits  POCT I-STAT, CHEM 8 - Abnormal; Notable for the following:    Potassium 3.4 (*)    Creatinine, Ser 1.20 (*)    Calcium, Ion 1.08 (*)    Hemoglobin 16.0 (*)    HCT 47.0 (*)    All other components within normal limits  LIPASE, BLOOD  URINE MICROSCOPIC-ADD ON  POCT PREGNANCY, URINE   Imaging Review No results found.  EKG Interpretation   None      8:14 PM Patient seen and examined. Work-up initiated. Medications ordered.   Vital signs reviewed and are as follows: Filed Vitals:   08/29/13 1944  BP: 114/67  Pulse: 75  Temp: 97.8 F (36.6 C)  Resp: 18   Patient and labs reviewed with Dr. Loretha Stapler.   Patient wants to go home. Will give rx for zofran and omeprazole.   Encouraged to quit drinking. The patient was urged to return to the Emergency Department immediately with worsening of current symptoms, worsening abdominal pain, persistent vomiting, blood noted in stools, fever, or any other concerns. The patient verbalized understanding.      MDM   1. Gastritis    Pt with intermittent abdominal pain, vomiting -- occurring over the past month. Abd is soft. Suspect her problems including diarrhea are likely caused or exacerbated by her chronic alcohol use. Patient is tolerating PO's and  appears well. She is requesting discharge to home. Exam is unchanged during time in ED. Labs suggest changes 2/2 EtOH use. Do not suspect any emergent or danger medical conditions here.     Renne Crigler, PA-C 08/30/13 [phone]

## 2013-08-29 NOTE — ED Notes (Signed)
Patient up to bathroom to provide urine specimen Will medicate when returned to room

## 2013-08-29 NOTE — ED Notes (Signed)
Bed: ZO10 Expected date:  Expected time:  Means of arrival:  Comments: EMS 47yo F abd pain

## 2013-08-29 NOTE — ED Notes (Signed)
Patient informed of need for urine specimen--patient states that she does not have the urge to void at this time

## 2013-08-29 NOTE — ED Provider Notes (Signed)
Medical screening examination/treatment/procedure(s) were conducted as a shared visit with non-physician practitioner(s) and myself.  I personally evaluated the patient during the encounter.  EKG Interpretation   None       47 yo female with hx of alcohol abuse presenting with abdominal pain and vomiting.  Pain resolved after GI cocktail and IV fluids.  Abdominal exam shows minimal epigastric tenderness without r/r/g.  She has also sobered to the point that she is safe for discharge with her aunt.  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  Advised alcohol cessation treatment and H2 blocker.   Clinical Impression: 1. Gastritis       Candyce Churn, MD 08/29/13 2330

## 2013-08-29 NOTE — ED Notes (Signed)
Pt has had abdominal pain for about a month,  Hasn't had period in two months,  Pain 8/10

## 2013-08-29 NOTE — ED Notes (Signed)
Blood pressure by EMS 138/100

## 2013-08-29 NOTE — ED Notes (Signed)
Patient has called out several times stating that she is ready to go home and that she was told by "the doctor" that she can go. Patient states that she has called her sister to come pick her up and that she is on the way. Discharge orders have not been written or placed. Will d/w PA.

## 2013-08-31 NOTE — ED Provider Notes (Signed)
Medical screening examination/treatment/procedure(s) were conducted as a shared visit with non-physician practitioner(s) and myself.  I personally evaluated the patient during the encounter.   Please see my separate note.     Candyce Churn, MD 08/31/13 [phone]

## 2014-03-15 ENCOUNTER — Emergency Department (HOSPITAL_COMMUNITY)
Admission: EM | Admit: 2014-03-15 | Discharge: 2014-03-15 | Disposition: A | Discharge status: Home / Self Care | Payer: Medicaid Other | Attending: Emergency Medicine | Admitting: Emergency Medicine

## 2014-03-15 ENCOUNTER — Encounter (HOSPITAL_COMMUNITY): Payer: Self-pay | Admitting: Emergency Medicine

## 2014-03-15 DIAGNOSIS — L509 Urticaria, unspecified: Secondary | ICD-10-CM | POA: Insufficient documentation

## 2014-03-15 DIAGNOSIS — Z880 Allergy status to penicillin: Secondary | ICD-10-CM | POA: Insufficient documentation

## 2014-03-15 DIAGNOSIS — Z79899 Other long term (current) drug therapy: Secondary | ICD-10-CM | POA: Insufficient documentation

## 2014-03-15 DIAGNOSIS — F17200 Nicotine dependence, unspecified, uncomplicated: Secondary | ICD-10-CM | POA: Insufficient documentation

## 2014-03-15 DIAGNOSIS — K219 Gastro-esophageal reflux disease without esophagitis: Secondary | ICD-10-CM | POA: Insufficient documentation

## 2014-03-15 DIAGNOSIS — I10 Essential (primary) hypertension: Secondary | ICD-10-CM | POA: Insufficient documentation

## 2014-03-15 HISTORY — DX: Gastro-esophageal reflux disease without esophagitis: K21.9

## 2014-03-15 MED ORDER — DIPHENHYDRAMINE HCL 25 MG PO CAPS
25.0000 mg | ORAL_CAPSULE | Freq: Once | ORAL | Status: AC
  Administered 2014-03-15: 25 mg via ORAL
  Filled 2014-03-15: qty 1

## 2014-03-15 MED ORDER — DIPHENHYDRAMINE HCL 25 MG PO TABS: 25.0000 mg | ORAL_TABLET | Freq: Four times a day (QID) | ORAL | Status: DC

## 2014-03-15 MED ORDER — DIPHENHYDRAMINE HCL 50 MG/ML IJ SOLN: 25.0000 mg | Freq: Once | INTRAMUSCULAR | Status: DC

## 2014-03-15 MED ORDER — HYDROCORTISONE 1 % EX OINT
TOPICAL_OINTMENT | Freq: Three times a day (TID) | CUTANEOUS | Status: DC
  Administered 2014-03-15: 1 via TOPICAL
  Filled 2014-03-15: qty 28.35

## 2014-03-15 NOTE — ED Notes (Signed)
Per EMS, patient c/o allergic reaction x2 days. Patient with 2 bug bites to left forearm, and 2 bug bites to right hand/wrist.

## 2014-03-15 NOTE — ED Notes (Signed)
Bed: [facility]2 Expected date: 03/15/14 Expected time: 12:24 AM Means of arrival: Ambulance Comments: Allergic reaction

## 2014-03-15 NOTE — Discharge Instructions (Signed)
Use the cream you were given to help with your itching and rash. Take Benadryl for itching and rash as well. Followup with a primary care provider for continued evaluation and treatment.    Hives Hives are itchy, red, puffy (swollen) areas of the skin. Hives can change in size and location on your body. Hives can come and go for hours, days, or weeks. Hives do not spread from person to person (noncontagious). Scratching, exercise, and stress can make your hives worse. HOME CARE  Avoid things that cause your hives (triggers).  Take antihistamine medicines as told by your doctor. Do not drive while taking an antihistamine.  Take any other medicines for itching as told by your doctor.  Wear loose-fitting clothing.  Keep all doctor visits as told. GET HELP RIGHT AWAY IF:   You have a fever.  Your tongue or lips are puffy.  You have trouble breathing or swallowing.  You feel tightness in the throat or chest.  You have belly (abdominal) pain.  You have lasting or severe itching that is not helped by medicine.  You have painful or puffy joints. These problems may be the first sign of a life-threatening allergic reaction. Call your local emergency services (911 in [place] MAKE SURE YOU:   Understand these instructions.  Will watch your condition.  Will get help right away if you are not doing well or get worse. Document Released: 06/11/2008 Document Revised: 03/03/2012 Document Reviewed: 11/26/2011 [facility] Patient Information 2015 [employer], [place]. This information is not intended to replace advice given to you by your health care provider. Make sure you discuss any questions you have with your health care provider.

## 2014-03-15 NOTE — ED Provider Notes (Signed)
Medical screening examination/treatment/procedure(s) were performed by non-physician practitioner and as supervising physician I was immediately available for consultation/collaboration.   EKG Interpretation None       Olivia Mackielga M Otter, MD 03/15/14 [phone]

## 2014-03-15 NOTE — ED Provider Notes (Signed)
CSN: [id number]     Arrival date & time 03/15/14  0030 History   First MD Initiated Contact with Patient 03/15/14 [phone]     Chief Complaint  Patient presents with  . bug bites     2 on left forearm, 2 on right hand/wrist   HPI  History provided by the patient. patient is a 48 year old female with history of hypertension, seasonal allergies and GERD presenting itching and rash to the skin. She reports having some small areas popup on her arms as well as some rash and itching to her chest has been there for the last several days. She was not sure if this was possibly from bug bites. She denies having similar symptoms previously. Denies any soaps, lotions or creams. No new clothing. Denies any swelling of the lips, tongue or throat. No shortness of breath.   Past Medical History  Diagnosis Date  . No pertinent past medical history   . Hypertension   . GERD (gastroesophageal reflux disease)    Past Surgical History  Procedure Laterality Date  . Cesarean section     No family history on file. History  Substance Use Topics  . Smoking status: Current Every Day Smoker -- 0.25 packs/day    Types: Cigarettes  . Smokeless tobacco: Not on file  . Alcohol Use: Yes     Comment: 2 40 oz weekly   OB History   Grav Para Term Preterm Abortions TAB SAB Ect Mult Living                 Review of Systems  Constitutional: Negative for fever, chills and diaphoresis.  Respiratory: Negative for shortness of breath.   Skin: Positive for rash.  All other systems reviewed and are negative.     Allergies  Ibuprofen and Penicillins  Home Medications   Prior to Admission medications   Medication Sig Start Date End Date Taking? Authorizing Provider  Aspirin-Salicylamide-Caffeine (BC HEADACHE PO) Take 1 packet by mouth daily.    Historical Provider, MD  aspirin-sod bicarb-citric acid (ALKA-SELTZER) 325 MG TBEF tablet Take 325 mg by mouth every 6 (six) hours as needed.    Historical Provider, MD   hydrochlorothiazide (HYDRODIURIL) 25 MG tablet Take 1 tablet (25 mg total) by mouth daily. 05/28/13   Toy BakerAnthony T Allen, MD  omeprazole (PRILOSEC) 20 MG capsule Take one capsule PO twice a day for 3 days, then one capsule PO once a day 08/29/13   Renne CriglerJoshua Geiple, PA-C  ondansetron (ZOFRAN ODT) 4 MG disintegrating tablet Take 1 tablet (4 mg total) by mouth every 8 (eight) hours as needed for nausea or vomiting. 08/29/13   Renne CriglerJoshua Geiple, PA-C   BP 168/85  Pulse 68  Temp(Src) 98.1 F (36.7 C) (Oral)  Resp 18  Ht 5\' 1"  (1.549 m)  SpO2 100%  LMP 10/15/2013 Physical Exam  Nursing note and vitals reviewed. Constitutional: She is oriented to person, place, and time. She appears well-developed and well-nourished. No distress.  HENT:  Head: Normocephalic and atraumatic.  Mouth/Throat: Oropharynx is clear and moist.  Neck: Normal range of motion. Neck supple.  Cardiovascular: Normal rate and regular rhythm.   Pulmonary/Chest: Effort normal and breath sounds normal. No stridor. No respiratory distress. She has no wheezes. She has no rales.  Abdominal: Soft.  Neurological: She is alert and oriented to person, place, and time.  Skin: Skin is warm and dry.  There are a few urticarial type regions left upper arm, forearm and right hand and wrist  area. There is a fine  maculopapular rash over the chest and lower neck area.  Psychiatric: She has a normal mood and affect. Her behavior is normal.    ED Course  Procedures    COORDINATION OF CARE:  Nursing notes reviewed. Vital signs reviewed. Initial pt interview and examination performed.   Filed Vitals:   03/15/14 0031  BP: 168/85  Pulse: 68  Temp: 98.1 F (36.7 C)  TempSrc: Oral  Resp: 18  Height: 5\' 1"  (1.549 m)  SpO2: 100%    12:59 AM-patient seen and evaluated. Well appearing no acute distress. Slight rash to the chest. Urticarial lesions to the left forearm. Consistent with allergic reaction. We'll give dose of Benadryl recommend  hydrocortisone creams.   Treatment plan initiated: Medications  diphenhydrAMINE (BENADRYL) injection 25 mg (not administered)  hydrocortisone 1 % ointment (not administered)             MDM   Final diagnoses:  Hives       Angus Sellereter S Brenlynn Fake, PA-C 03/15/14 0111

## 2014-03-15 NOTE — ED Notes (Signed)
Patient states she is here for a rash around her neck that started @1  week ago. Patient states she now has hives and points to 2 small bumps on her left forearm and 2 on her right wrist/hand that look like bug bites. No medications taken at home.

## 2014-03-22 ENCOUNTER — Encounter (HOSPITAL_COMMUNITY): Payer: Self-pay | Admitting: Emergency Medicine

## 2014-03-22 ENCOUNTER — Emergency Department (HOSPITAL_COMMUNITY): Payer: Medicaid Other

## 2014-03-22 ENCOUNTER — Emergency Department (HOSPITAL_COMMUNITY)
Admission: EM | Admit: 2014-03-22 | Discharge: 2014-03-23 | Disposition: A | Discharge status: Home / Self Care | Payer: Medicaid Other | Attending: Emergency Medicine | Admitting: Emergency Medicine

## 2014-03-22 DIAGNOSIS — R059 Cough, unspecified: Secondary | ICD-10-CM

## 2014-03-22 DIAGNOSIS — K219 Gastro-esophageal reflux disease without esophagitis: Secondary | ICD-10-CM | POA: Insufficient documentation

## 2014-03-22 DIAGNOSIS — Z880 Allergy status to penicillin: Secondary | ICD-10-CM | POA: Diagnosis not present

## 2014-03-22 DIAGNOSIS — Z79899 Other long term (current) drug therapy: Secondary | ICD-10-CM | POA: Diagnosis not present

## 2014-03-22 DIAGNOSIS — J40 Bronchitis, not specified as acute or chronic: Secondary | ICD-10-CM | POA: Insufficient documentation

## 2014-03-22 DIAGNOSIS — F17200 Nicotine dependence, unspecified, uncomplicated: Secondary | ICD-10-CM | POA: Diagnosis not present

## 2014-03-22 DIAGNOSIS — R05 Cough: Secondary | ICD-10-CM

## 2014-03-22 DIAGNOSIS — I10 Essential (primary) hypertension: Secondary | ICD-10-CM | POA: Diagnosis not present

## 2014-03-22 DIAGNOSIS — R0602 Shortness of breath: Secondary | ICD-10-CM | POA: Diagnosis present

## 2014-03-22 LAB — BASIC METABOLIC PANEL
ANION GAP: 17 — AB (ref 5–15)
BUN: 8 mg/dL (ref 6–23)
CHLORIDE: 98 meq/L (ref 96–112)
CO2: 24 meq/L (ref 19–32)
CREATININE: 0.66 mg/dL (ref 0.50–1.10)
Calcium: 9.3 mg/dL (ref 8.4–10.5)
GFR calc Af Amer: >90 mL/min (ref >90)
GFR calc non Af Amer: >90 mL/min (ref >90)
GLUCOSE: 90 mg/dL (ref 70–99)
Potassium: 3.9 mEq/L (ref 3.7–5.3)
Sodium: 139 mEq/L (ref 137–147)

## 2014-03-22 LAB — I-STAT TROPONIN, ED: Troponin i, poc: 0 ng/mL (ref 0.00–0.08)

## 2014-03-22 MED ORDER — PREDNISONE 20 MG PO TABS
60.0000 mg | ORAL_TABLET | Freq: Once | ORAL | Status: AC
  Administered 2014-03-22: 60 mg via ORAL
  Filled 2014-03-22: qty 3

## 2014-03-22 MED ORDER — IPRATROPIUM-ALBUTEROL 0.5-2.5 (3) MG/3ML IN SOLN
3.0000 mL | Freq: Once | RESPIRATORY_TRACT | Status: AC
  Administered 2014-03-22: 3 mL via RESPIRATORY_TRACT
  Filled 2014-03-22: qty 3

## 2014-03-22 NOTE — ED Notes (Signed)
EKG given to EDP,Knapp,J. For review. 

## 2014-03-22 NOTE — ED Notes (Signed)
Brought in by EMS from home with c/o congestion and shortness of breath.  Per EMS, pt reported that she has been having "congestion" for the past 4 days and reports that "she is getting sick".

## 2014-03-22 NOTE — ED Provider Notes (Signed)
CSN: [id number]     Arrival date & time 03/22/14  2208 History   First MD Initiated Contact with Patient 03/22/14 2217     Chief Complaint  Patient presents with  . Shortness of Breath     (Consider location/radiation/quality/duration/timing/severity/associated sxs/prior Treatment) Patient is a 48 y.o. female presenting with shortness of breath. The history is provided by the patient and medical records.  Shortness of Breath Associated symptoms: cough and fever (subjective)    This is a 48 year old female with past medical history significant for hypertension and GERD, presenting to the ED for cough and congestion x4 days. Patient states she's been having productive cough, nasal and chest congestion, postnasal drip, subjective fever, and chills.  Denies recent sick contacts.  States that earlier today her cough became so intense, that she began feeling short of breath. She states she does have some pain in her chest with coughing, but none when lying still or not coughing.  Patient is a daily smoker, less than one pack per day.   She denies prior cardiac history, but does have family history of MI.  No prior hx of DVT or PE.  Vital signs stable on arrival.  Past Medical History  Diagnosis Date  . No pertinent past medical history   . Hypertension   . GERD (gastroesophageal reflux disease)    Past Surgical History  Procedure Laterality Date  . Cesarean section     History reviewed. No pertinent family history. History  Substance Use Topics  . Smoking status: Current Every Day Smoker -- 0.25 packs/day    Types: Cigarettes  . Smokeless tobacco: Not on file  . Alcohol Use: Yes     Comment: 2 40 oz weekly   OB History   Grav Para Term Preterm Abortions TAB SAB Ect Mult Living                 Review of Systems  Constitutional: Positive for fever (subjective) and chills.  HENT: Positive for congestion and postnasal drip.   Respiratory: Positive for cough and shortness of breath.    All other systems reviewed and are negative.     Allergies  Ibuprofen and Penicillins  Home Medications   Prior to Admission medications   Medication Sig Start Date End Date Taking? Authorizing Provider  albuterol (PROVENTIL HFA;VENTOLIN HFA) 108 (90 BASE) MCG/ACT inhaler Inhale 1 puff into the lungs every 6 (six) hours as needed for wheezing or shortness of breath.   Yes Historical Provider, MD  diphenhydrAMINE (BENADRYL) 25 MG tablet Take 1 tablet (25 mg total) by mouth every 6 (six) hours. 03/15/14  Yes Peter S Dammen, PA-C  hydrochlorothiazide (HYDRODIURIL) 25 MG tablet Take 1 tablet (25 mg total) by mouth daily. 05/28/13  Yes Toy BakerAnthony T Allen, MD  omeprazole (PRILOSEC) 20 MG capsule Take 20 mg by mouth 2 (two) times daily before a meal.   Yes Historical Provider, MD  Phenyleph-CPM-DM-APAP (ALKA-SELTZER PLUS COLD & COUGH) 01-15-09-325 MG CAPS Take 2 capsules by mouth every 4 (four) hours as needed (cold symptoms).   Yes Historical Provider, MD  Pseudoeph-Doxylamine-DM-APAP (NYQUIL PO) Take 2 capsules by mouth every 6 (six) hours.   Yes Historical Provider, MD   BP 154/99  Pulse 93  Temp(Src) 98.2 F (36.8 C) (Oral)  Resp 21  SpO2 100%  LMP 10/15/2013  Physical Exam  Nursing note and vitals reviewed. Constitutional: She is oriented to person, place, and time. She appears well-developed and well-nourished. No distress.  HENT:  Head:  Normocephalic and atraumatic.  Mouth/Throat: Oropharynx is clear and moist.  Postnasal drip noted; Tonsils normal in appearance bilaterally without exudate; uvula midline without peritonsillar abscess; handling secretions appropriately; no difficulty swallowing or speaking  Eyes: Conjunctivae and EOM are normal. Pupils are equal, round, and reactive to light.  Neck: Normal range of motion. Neck supple.  Cardiovascular: Normal rate, regular rhythm and normal heart sounds.   Pulmonary/Chest: Effort normal and breath sounds normal. No respiratory  distress.  Respirations unlabored, coarse breath sounds bilaterally without audible wheezes or rhonchi, speaking in full complete sentences without difficulty  Abdominal: Soft. Bowel sounds are normal. There is no tenderness. There is no guarding.  Musculoskeletal: Normal range of motion. She exhibits no edema.  Neurological: She is alert and oriented to person, place, and time.  Skin: Skin is warm and dry. She is not diaphoretic.  Psychiatric: She has a normal mood and affect.    ED Course  Procedures (including critical care time) Labs Review Labs Reviewed  BASIC METABOLIC PANEL - Abnormal; Notable for the following:    Anion gap 17 (*)    All other components within normal limits  CBC WITH DIFFERENTIAL - Abnormal; Notable for the following:    MCV 74.6 (*)    MCH 24.1 (*)    RDW 15.8 (*)    All other components within normal limits  CBC WITH DIFFERENTIAL  Rosezena SensorI-STAT TROPOININ, ED    Imaging Review Dg Chest 2 View  03/23/2014   CLINICAL DATA:  Cough.  Shortness of breath.  EXAM: CHEST  2 VIEW  COMPARISON:  06/23/2013  FINDINGS: The heart size and mediastinal contours are within normal limits. Both lungs are clear. The visualized skeletal structures are unremarkable.  IMPRESSION: No active cardiopulmonary disease.   Electronically Signed   By: Myles RosenthalJohn  Stahl M.D.   On: 03/23/2014 00:03     EKG Interpretation None      MDM   Final diagnoses:  Bronchitis  Cough   Person-year-old feel with cold-like symptoms over the past 4 days. Today she began experiencing shortness breath after heavy coughing. She does note some chest discomfort occurring when coughing, described as a soreness. No personal cardiac history.  On exam she is afebrile and nontoxic appearing. Her respirations are unlabored, she does have coarse breath sounds bilaterally but no audible wheezes or rhonchi.  We'll plan for basic labs, EKG, chest x-ray. DuoNeb ordered as well as dose of prednisone.  VS stable at this  time.   EKG sinus rhythm without ischemic change. Troponin is negative. Chest x-ray is clear. Lab work is reassuring. After doing lung sounds have improved. Patient remains afebrile without any respiratory distress.  Pt is PERC negative.  At this time I have low suspicion for ACS, PE, dissection, or other acute cardiac event.  Symptoms likely due to bronchitis. Patient given albuterol inhaler and discharged home with prednisone taper. She was instructed to use over-the-counter cough suppressants as needed. She will follow with her primary care physician.  Discussed plan with patient, he/she acknowledged understanding and agreed with plan of care.  Return precautions given for new or worsening symptoms.  Garlon HatchetLisa M Sanders, PA-C 03/23/14 [phone]

## 2014-03-22 NOTE — ED Notes (Signed)
Bed: WA25 Expected date:  Expected time:  Means of arrival:  Comments: EMS  

## 2014-03-23 LAB — CBC WITH DIFFERENTIAL/PLATELET
BASOS ABS: 0 10*3/uL (ref 0.0–0.1)
Basophils Relative: 0 % (ref 0–1)
EOS PCT: 2 % (ref 0–5)
Eosinophils Absolute: 0.2 10*3/uL (ref 0.0–0.7)
HCT: 37.8 % (ref 36.0–46.0)
HEMOGLOBIN: 12.2 g/dL (ref 12.0–15.0)
Lymphocytes Relative: 33 % (ref 12–46)
Lymphs Abs: 2.7 10*3/uL (ref 0.7–4.0)
MCH: 24.1 pg — ABNORMAL LOW (ref 26.0–34.0)
MCHC: 32.3 g/dL (ref 30.0–36.0)
MCV: 74.6 fL — AB (ref 78.0–100.0)
MONO ABS: 0.6 10*3/uL (ref 0.1–1.0)
Monocytes Relative: 7 % (ref 3–12)
Neutro Abs: 4.8 10*3/uL (ref 1.7–7.7)
Neutrophils Relative %: 58 % (ref 43–77)
Platelets: 242 10*3/uL (ref 150–400)
RBC: 5.07 MIL/uL (ref 3.87–5.11)
RDW: 15.8 % — AB (ref 11.5–15.5)
WBC: 8.3 10*3/uL (ref 4.0–10.5)

## 2014-03-23 MED ORDER — ALBUTEROL SULFATE HFA 108 (90 BASE) MCG/ACT IN AERS
1.0000 | INHALATION_SPRAY | RESPIRATORY_TRACT | Status: DC | PRN
  Administered 2014-03-23: 2 via RESPIRATORY_TRACT
  Filled 2014-03-23: qty 6.7

## 2014-03-23 MED ORDER — PREDNISONE 20 MG PO TABS: 40.0000 mg | ORAL_TABLET | Freq: Every day | ORAL | Status: DC

## 2014-03-23 NOTE — ED Provider Notes (Signed)
Medical screening examination/treatment/procedure(s) were performed by non-physician practitioner and as supervising physician I was immediately available for consultation/collaboration.   EKG Interpretation None        David H Yao, MD 03/23/14 2119 

## 2014-03-23 NOTE — Discharge Instructions (Signed)
Workup today was normal. Please use inhaler as instructed in the ED every 4 hours as needed for wheezing or shortness of breath. Start taking prednisone tomorrow, your cardiac today's dose. Return to the ED for any new concerns.

## 2014-04-01 ENCOUNTER — Encounter (HOSPITAL_COMMUNITY): Payer: Self-pay | Admitting: Emergency Medicine

## 2014-04-01 ENCOUNTER — Emergency Department (HOSPITAL_COMMUNITY): Payer: Medicaid Other

## 2014-04-01 ENCOUNTER — Emergency Department (HOSPITAL_COMMUNITY)
Admission: EM | Admit: 2014-04-01 | Discharge: 2014-04-01 | Disposition: A | Discharge status: Home / Self Care | Payer: Medicaid Other | Attending: Emergency Medicine | Admitting: Emergency Medicine

## 2014-04-01 DIAGNOSIS — M25569 Pain in unspecified knee: Secondary | ICD-10-CM | POA: Diagnosis present

## 2014-04-01 DIAGNOSIS — I10 Essential (primary) hypertension: Secondary | ICD-10-CM | POA: Insufficient documentation

## 2014-04-01 DIAGNOSIS — F17200 Nicotine dependence, unspecified, uncomplicated: Secondary | ICD-10-CM | POA: Diagnosis not present

## 2014-04-01 DIAGNOSIS — F1010 Alcohol abuse, uncomplicated: Secondary | ICD-10-CM | POA: Insufficient documentation

## 2014-04-01 DIAGNOSIS — M25561 Pain in right knee: Secondary | ICD-10-CM

## 2014-04-01 DIAGNOSIS — K219 Gastro-esophageal reflux disease without esophagitis: Secondary | ICD-10-CM | POA: Insufficient documentation

## 2014-04-01 DIAGNOSIS — Z880 Allergy status to penicillin: Secondary | ICD-10-CM | POA: Insufficient documentation

## 2014-04-01 DIAGNOSIS — Z79899 Other long term (current) drug therapy: Secondary | ICD-10-CM | POA: Insufficient documentation

## 2014-04-01 HISTORY — DX: Alcohol abuse, uncomplicated: F10.10

## 2014-04-01 MED ORDER — HYDROCODONE-ACETAMINOPHEN 5-325 MG PO TABS: 1.0000 | ORAL_TABLET | Freq: Four times a day (QID) | ORAL | Status: DC | PRN

## 2014-04-01 NOTE — ED Provider Notes (Signed)
Medical screening examination/treatment/procedure(s) were performed by non-physician practitioner and as supervising physician I was immediately available for consultation/collaboration.   EKG Interpretation None        Hanley SeamenJohn L Arianny Pun, MD 04/01/14 [phone]

## 2014-04-01 NOTE — ED Provider Notes (Signed)
CSN: [id number]     Arrival date & time 04/01/14  0113 History   First MD Initiated Contact with Patient 04/01/14 0152     Chief Complaint  Patient presents with  . Knee Pain    Right knee pain x 1 week     (Consider location/radiation/quality/duration/timing/severity/associated sxs/prior Treatment) HPI Comments: Patient presents to the emergency department with chief complaint of right knee pain. She states that she has had the pain for over a week. She denies any mechanism of injury. She reports clicking and popping sensation when she ambulates. She denies any recent travel, or surgery. No history of blood clots. No fevers or chills. Has not taken anything to alleviate her symptoms. Is able to ambulate.  The history is provided by the patient. No language interpreter was used.    Past Medical History  Diagnosis Date  . No pertinent past medical history   . Hypertension   . GERD (gastroesophageal reflux disease)   . Alcohol abuse    Past Surgical History  Procedure Laterality Date  . Cesarean section     History reviewed. No pertinent family history. History  Substance Use Topics  . Smoking status: Current Every Day Smoker -- 0.25 packs/day    Types: Cigarettes  . Smokeless tobacco: Not on file  . Alcohol Use: Yes     Comment: 2 40 oz weekly   OB History   Grav Para Term Preterm Abortions TAB SAB Ect Mult Living                 Review of Systems  Constitutional: Negative for fever and chills.  Respiratory: Negative for shortness of breath.   Cardiovascular: Negative for chest pain.  Gastrointestinal: Negative for nausea, vomiting, diarrhea and constipation.  Genitourinary: Negative for dysuria.  Musculoskeletal: Positive for arthralgias.      Allergies  Ibuprofen and Penicillins  Home Medications   Prior to Admission medications   Medication Sig Start Date End Date Taking? Authorizing Provider  albuterol (PROVENTIL HFA;VENTOLIN HFA) 108 (90 BASE) MCG/ACT  inhaler Inhale 1 puff into the lungs every 6 (six) hours as needed for wheezing or shortness of breath.   Yes Historical Provider, MD  diphenhydrAMINE (BENADRYL) 25 MG tablet Take 1 tablet (25 mg total) by mouth every 6 (six) hours. 03/15/14  Yes Peter S Dammen, PA-C  omeprazole (PRILOSEC) 20 MG capsule Take 20 mg by mouth 2 (two) times daily before a meal.   Yes Historical Provider, MD   BP 134/86  Pulse 88  Temp(Src) 98 F (36.7 C) (Oral)  Resp 20  Ht 5\' 1"  (1.549 m)  Wt 171 lb (77.565 kg)  BMI 32.33 kg/m2  SpO2 100%  LMP 10/15/2013 Physical Exam  Nursing note and vitals reviewed. Constitutional: She is oriented to person, place, and time. She appears well-developed and well-nourished.  HENT:  Head: Normocephalic and atraumatic.  Eyes: Conjunctivae and EOM are normal.  Neck: Normal range of motion.  Cardiovascular: Normal rate.   Intact distal pulses with brisk capillary refill  Pulmonary/Chest: Effort normal.  Abdominal: She exhibits no distension.  Musculoskeletal: Normal range of motion.  Right knee tenderness to palpation over the medial joint lines, mild swelling, no evidence of DVT, or septic joint, range of motion and strength 5/5  Neurological: She is alert and oriented to person, place, and time.  Sensation intact  Skin: Skin is dry.  Psychiatric: She has a normal mood and affect. Her behavior is normal. Judgment and thought content normal.  ED Course  Procedures (including critical care time) Labs Review Labs Reviewed - No data to display  Imaging Review Dg Knee Complete 4 Views Right  04/01/2014   CLINICAL DATA:  Knee pain.  No known injury  EXAM: RIGHT KNEE - COMPLETE 4+ VIEW  COMPARISON:  None.  FINDINGS: There is no evidence of fracture, dislocation, or joint effusion. There is minimal degenerative peaking of the tibial spines, without joint narrowing.  IMPRESSION: No acute osseous findings.   Electronically Signed   By: Tiburcio Pea M.D.   On: 04/01/2014  02:40     EKG Interpretation None      MDM   Final diagnoses:  Knee pain, acute, right    Patient with right knee pain. Suspect meniscal injury vs. arthritis. We'll check plain films. Anticipate discharge to home with orthopedic followup, and a knee sleeve.    Roxy Horseman, PA-C 04/01/14 [phone]

## 2014-04-01 NOTE — ED Notes (Signed)
Patient transported to X-ray 

## 2014-04-01 NOTE — ED Notes (Signed)
Warm blanket and socks given Patient asking for food/drink---patient informed that she needs to be seen by EDP

## 2014-04-01 NOTE — Discharge Instructions (Signed)

## 2014-04-01 NOTE — ED Notes (Signed)
Bed: [id number] Expected date: 04/01/14 Expected time: 1:04 AM Means of arrival: Ambulance Comments: 48 yo F  Knee pain

## 2014-04-01 NOTE — ED Notes (Signed)
ED PA at bedside

## 2014-04-01 NOTE — ED Notes (Signed)
[facility] from a hotel off of [address] Patient called EMS due to c/o right knee pain that has been on-going for over one week Per EMS, patient ambulated from hotel room into ambulance Patient ambulatory from EMS bay to ED room 24 No obvious deformity and/or swelling noted to right knee Patient in NAD

## 2014-04-19 ENCOUNTER — Emergency Department (HOSPITAL_COMMUNITY)
Admission: EM | Admit: 2014-04-19 | Discharge: 2014-04-19 | Disposition: A | Discharge status: Home / Self Care | Payer: Medicaid Other | Attending: Emergency Medicine | Admitting: Emergency Medicine

## 2014-04-19 ENCOUNTER — Encounter (HOSPITAL_COMMUNITY): Payer: Self-pay | Admitting: Emergency Medicine

## 2014-04-19 DIAGNOSIS — I10 Essential (primary) hypertension: Secondary | ICD-10-CM | POA: Insufficient documentation

## 2014-04-19 DIAGNOSIS — Z5900 Homelessness unspecified: Secondary | ICD-10-CM | POA: Insufficient documentation

## 2014-04-19 DIAGNOSIS — Z880 Allergy status to penicillin: Secondary | ICD-10-CM | POA: Insufficient documentation

## 2014-04-19 DIAGNOSIS — Z79899 Other long term (current) drug therapy: Secondary | ICD-10-CM | POA: Insufficient documentation

## 2014-04-19 DIAGNOSIS — F17200 Nicotine dependence, unspecified, uncomplicated: Secondary | ICD-10-CM | POA: Insufficient documentation

## 2014-04-19 DIAGNOSIS — M25569 Pain in unspecified knee: Secondary | ICD-10-CM | POA: Diagnosis not present

## 2014-04-19 DIAGNOSIS — K219 Gastro-esophageal reflux disease without esophagitis: Secondary | ICD-10-CM | POA: Diagnosis not present

## 2014-04-19 DIAGNOSIS — M25561 Pain in right knee: Secondary | ICD-10-CM

## 2014-04-19 HISTORY — DX: Homelessness unspecified: Z59.00

## 2014-04-19 HISTORY — DX: Homelessness: Z59.0

## 2014-04-19 MED ORDER — TRAMADOL HCL 50 MG PO TABS: 50.0000 mg | ORAL_TABLET | Freq: Four times a day (QID) | ORAL | Status: DC | PRN

## 2014-04-19 NOTE — Discharge Instructions (Signed)
Use the immobilizer and crutches as needed.  Crutch Use Crutches are used to take weight off one of your legs or feet when you stand or walk. It is important to use crutches that fit properly. When fitted properly:  Each crutch should be 2-3 finger widths below the armpit.  Your weight should be supported by your hand, and not by resting the armpit on the crutch.  RISKS AND COMPLICATIONS Damage to the nerves that extend from your armpit to your hand and arm. To prevent this from happening, make sure your crutches fit properly and do not put pressure on your armpit when using them. HOW TO USE YOUR CRUTCHES If you have been instructed to use partial weight bearing, apply (bear) the amount of weight as your health care provider suggests. Do not bear weight in an amount that causes pain to the area of injury. Walking 1. Step with the crutches. 2. Swing the healthy leg slightly ahead of the crutches. Going Up Steps If there is no handrail: 1. Step up with the healthy leg. 2. Step up with the crutches and injured leg. 3. Continue in this way. If there is a handrail: 1. Hold both crutches in one hand. 2. Place your free hand on the handrail. 3. While putting your weight on your arms, lift your healthy leg to the step. 4. Bring the crutches and the injured leg up to that step. 5. Continue in this way. Going Down Steps Be very careful, as going down stairs with crutches is very challenging. If there is no handrail: 1. Step down with the injured leg and crutches. 2. Step down with the healthy leg. If there is a handrail: 1. Place your hand on the handrail. 2. Hold both crutches with your free hand. 3. Lower your injured leg and crutch to the step below you. Make sure to keep the crutch tips in the center of the step, never on the edge. 4. Lower your healthy leg to that step. 5. Continue in this way. Standing Up 1. Hold the injured leg forward. 2. Grab the armrest with one hand and the  top of the crutches with the other hand. 3. Using these supports, pull yourself up to a standing position. Sitting Down 1. Hold the injured leg forward. 2. Grab the armrest with one hand and the top of the crutches with the other hand. 3. Lower yourself to a sitting position. SEEK MEDICAL CARE IF:  You still feel unsteady on your feet.  You develop new pain, for example in your armpits, back, shoulder, wrist, or hip.  You develop any numbness or tingling. SEEK IMMEDIATE MEDICAL CARE IF: You fall. Document Released: 08/30/2000 Document Revised: 09/07/2013 Document Reviewed: 05/10/2013 [facility] Patient Information 2015 [place], [place]. This information is not intended to replace advice given to you by your health care provider. Make sure you discuss any questions you have with your health care provider.  Tramadol tablets What is this medicine? TRAMADOL (TRA ma dole) is a pain reliever. It is used to treat moderate to severe pain in adults. This medicine may be used for other purposes; ask your health care provider or pharmacist if you have questions. COMMON BRAND NAME(S): Ultram What should I tell my health care provider before I take this medicine? They need to know if you have any of these conditions: -brain tumor -depression -drug abuse or addiction -head injury -if you frequently drink alcohol containing drinks -kidney disease or trouble passing urine -liver disease -lung disease, asthma,  or breathing problems -seizures or epilepsy -suicidal thoughts, plans, or attempt; a previous suicide attempt by you or a family member -an unusual or allergic reaction to tramadol, codeine, other medicines, foods, dyes, or preservatives -pregnant or trying to get pregnant -breast-feeding How should I use this medicine? Take this medicine by mouth with a full glass of water. Follow the directions on the prescription label. If the medicine upsets your stomach, take it with food or milk. Do  not take more medicine than you are told to take. Talk to your pediatrician regarding the use of this medicine in children. Special care may be needed. Overdosage: If you think you have taken too much of this medicine contact a poison control center or emergency room at once. NOTE: This medicine is only for you. Do not share this medicine with others. What if I miss a dose? If you miss a dose, take it as soon as you can. If it is almost time for your next dose, take only that dose. Do not take double or extra doses. What may interact with this medicine? Do not take this medicine with any of the following medications: -MAOIs like Carbex, Eldepryl, Marplan, Nardil, and Parnate This medicine may also interact with the following medications: -alcohol or medicines that contain alcohol -antihistamines -benzodiazepines -bupropion -carbamazepine or oxcarbazepine -clozapine -cyclobenzaprine -digoxin -furazolidone -linezolid -medicines for depression, anxiety, or psychotic disturbances -medicines for migraine headache like almotriptan, eletriptan, frovatriptan, naratriptan, rizatriptan, sumatriptan, zolmitriptan -medicines for pain like pentazocine, buprenorphine, butorphanol, meperidine, nalbuphine, and propoxyphene -medicines for sleep -muscle relaxants -naltrexone -phenobarbital -phenothiazines like perphenazine, thioridazine, chlorpromazine, mesoridazine, fluphenazine, prochlorperazine, promazine, and trifluoperazine -procarbazine -warfarin This list may not describe all possible interactions. Give your health care provider a list of all the medicines, herbs, non-prescription drugs, or dietary supplements you use. Also tell them if you smoke, drink alcohol, or use illegal drugs. Some items may interact with your medicine. What should I watch for while using this medicine? Tell your doctor or health care professional if your pain does not go away, if it gets worse, or if you have new or a  different type of pain. You may develop tolerance to the medicine. Tolerance means that you will need a higher dose of the medicine for pain relief. Tolerance is normal and is expected if you take this medicine for a long time. Do not suddenly stop taking your medicine because you may develop a severe reaction. Your body becomes used to the medicine. This does NOT mean you are addicted. Addiction is a behavior related to getting and using a drug for a non-medical reason. If you have pain, you have a medical reason to take pain medicine. Your doctor will tell you how much medicine to take. If your doctor wants you to stop the medicine, the dose will be slowly lowered over time to avoid any side effects. You may get drowsy or dizzy. Do not drive, use machinery, or do anything that needs mental alertness until you know how this medicine affects you. Do not stand or sit up quickly, especially if you are an older patient. This reduces the risk of dizzy or fainting spells. Alcohol can increase or decrease the effects of this medicine. Avoid alcoholic drinks. You may have constipation. Try to have a bowel movement at least every 2 to 3 days. If you do not have a bowel movement for 3 days, call your doctor or health care professional. Your mouth may get dry. Chewing sugarless gum or sucking hard  candy, and drinking plenty of water may help. Contact your doctor if the problem does not go away or is severe. What side effects may I notice from receiving this medicine? Side effects that you should report to your doctor or health care professional as soon as possible: -allergic reactions like skin rash, itching or hives, swelling of the face, lips, or tongue -breathing difficulties, wheezing -confusion -itching -light headedness or fainting spells -redness, blistering, peeling or loosening of the skin, including inside the mouth -seizures Side effects that usually do not require medical attention (report to your  doctor or health care professional if they continue or are bothersome): -constipation -dizziness -drowsiness -headache -nausea, vomiting This list may not describe all possible side effects. Call your doctor for medical advice about side effects. You may report side effects to FDA at 1-800-FDA-1088. Where should I keep my medicine? Keep out of the reach of children. Store at room temperature between 15 and 30 degrees C (59 and 86 degrees F). Keep container tightly closed. Throw away any unused medicine after the expiration date. NOTE: This sheet is a summary. It may not cover all possible information. If you have questions about this medicine, talk to your doctor, pharmacist, or health care provider.  2015, [employer] Standard. (2010-05-16 11:55:44)

## 2014-04-19 NOTE — ED Notes (Signed)
Glick, MD at bedside.  

## 2014-04-19 NOTE — ED Notes (Signed)
As soon as this RN stopped speaking to the pt, she fell asleep, and needed to be awoken to finish answering questions.

## 2014-04-19 NOTE — ED Notes (Signed)
Ortho to place knee immobilizer.

## 2014-04-19 NOTE — ED Notes (Signed)
Pt. [place] EMS from street [place] ( homeless) reports pain at right thigh/right knee for several weeks , denies recent fall or injury , ambulatory .

## 2014-04-19 NOTE — ED Provider Notes (Signed)
CSN: [id number]     Arrival date & time 04/19/14  0206 History   First MD Initiated Contact with Patient 04/19/14 0423     Chief Complaint  Patient presents with  . Leg Pain     (Consider location/radiation/quality/duration/timing/severity/associated sxs/prior Treatment) Patient is a 48 y.o. female presenting with leg pain. The history is provided by the patient.  Leg Pain She is complaining of pain in her right knee for the last two weeks. She was seen here and sent home with a knee immobilizer and crutches, but says she no longer has these. She denies any trauma. Pain is worse with movement, but nothing makes it better. She rates pain at 10/10. She denies any other joint or muscle pain.  Past Medical History  Diagnosis Date  . No pertinent past medical history   . Hypertension   . GERD (gastroesophageal reflux disease)   . Alcohol abuse   . Homelessness    Past Surgical History  Procedure Laterality Date  . Cesarean section     No family history on file. History  Substance Use Topics  . Smoking status: Current Every Day Smoker -- 0.25 packs/day    Types: Cigarettes  . Smokeless tobacco: Not on file  . Alcohol Use: Yes     Comment: 2 40 oz weekly   OB History   Grav Para Term Preterm Abortions TAB SAB Ect Mult Living                 Review of Systems  All other systems reviewed and are negative.     Allergies  Ibuprofen and Penicillins  Home Medications   Prior to Admission medications   Medication Sig Start Date End Date Taking? Authorizing Provider  albuterol (PROVENTIL HFA;VENTOLIN HFA) 108 (90 BASE) MCG/ACT inhaler Inhale 1 puff into the lungs every 6 (six) hours as needed for wheezing or shortness of breath.   Yes Historical Provider, MD  diphenhydrAMINE (BENADRYL) 25 MG tablet Take 1 tablet (25 mg total) by mouth every 6 (six) hours. 03/15/14  Yes Peter S Dammen, PA-C  HYDROcodone-acetaminophen (NORCO/VICODIN) 5-325 MG per tablet Take 1 tablet by mouth  every 6 (six) hours as needed for moderate pain or severe pain. 04/01/14  Yes Roxy Horseman, PA-C  omeprazole (PRILOSEC) 20 MG capsule Take 20 mg by mouth 2 (two) times daily before a meal.   Yes Historical Provider, MD   BP 111/56  Pulse 81  Temp(Src) 97.7 F (36.5 C) (Oral)  Resp 14  LMP 10/15/2013 Physical Exam  Nursing note and vitals reviewed.  47 year old female, resting comfortably and in no acute distress. Vital signs are normal. Oxygen saturation is 95%, which is normal. Head is normocephalic and atraumatic. PERRLA, EOMI. Oropharynx is clear. Neck is nontender and supple without adenopathy or JVD. Back is nontender and there is no CVA tenderness. Lungs are clear without rales, wheezes, or rhonchi. Chest is nontender. Heart has regular rate and rhythm without murmur. Abdomen is soft, flat, nontender without masses or hepatosplenomegaly and peristalsis is normoactive. Extremities have no cyanosis or edema, full range of motion is present. Right knee has no swelling or effusion. There is no instability. Lachman and McMurray tests are negative. There is pain with passive ROM. Skin is warm and dry without rash. Neurologic: Mental status is normal, cranial nerves are intact, there are no motor or sensory deficits.  ED Course  Procedures (including critical care time)  MDM   Final diagnoses:  Pain in right  knee    Right knee pain of uncertain cause. Old records are reviewed and she was seen on  July 17 and x-rays were negative. I see no indication to repeat the x-rays. She is given a new knee immobilizer and crutches and a prescription for Tramadol and is referred to orthopedics.    Dione Boozeavid Kissy Cielo, MD 04/19/14 [phone]

## 2014-05-22 ENCOUNTER — Encounter (HOSPITAL_COMMUNITY): Payer: Self-pay | Admitting: Emergency Medicine

## 2014-05-22 ENCOUNTER — Emergency Department (HOSPITAL_COMMUNITY)
Admission: EM | Admit: 2014-05-22 | Discharge: 2014-05-22 | Disposition: A | Discharge status: Home / Self Care | Payer: Medicaid Other | Attending: Emergency Medicine | Admitting: Emergency Medicine

## 2014-05-22 DIAGNOSIS — K219 Gastro-esophageal reflux disease without esophagitis: Secondary | ICD-10-CM | POA: Diagnosis not present

## 2014-05-22 DIAGNOSIS — M25569 Pain in unspecified knee: Secondary | ICD-10-CM | POA: Diagnosis present

## 2014-05-22 DIAGNOSIS — G8929 Other chronic pain: Secondary | ICD-10-CM | POA: Insufficient documentation

## 2014-05-22 DIAGNOSIS — M25561 Pain in right knee: Secondary | ICD-10-CM

## 2014-05-22 DIAGNOSIS — Z5900 Homelessness unspecified: Secondary | ICD-10-CM | POA: Insufficient documentation

## 2014-05-22 DIAGNOSIS — Z79899 Other long term (current) drug therapy: Secondary | ICD-10-CM | POA: Insufficient documentation

## 2014-05-22 DIAGNOSIS — Z880 Allergy status to penicillin: Secondary | ICD-10-CM | POA: Diagnosis not present

## 2014-05-22 DIAGNOSIS — F17200 Nicotine dependence, unspecified, uncomplicated: Secondary | ICD-10-CM | POA: Insufficient documentation

## 2014-05-22 DIAGNOSIS — Z791 Long term (current) use of non-steroidal anti-inflammatories (NSAID): Secondary | ICD-10-CM | POA: Diagnosis not present

## 2014-05-22 DIAGNOSIS — I10 Essential (primary) hypertension: Secondary | ICD-10-CM | POA: Insufficient documentation

## 2014-05-22 MED ORDER — MELOXICAM 15 MG PO TABS: 15.0000 mg | ORAL_TABLET | Freq: Every day | ORAL | Status: DC

## 2014-05-22 MED ORDER — HYDROCODONE-ACETAMINOPHEN 5-325 MG PO TABS
1.0000 | ORAL_TABLET | Freq: Once | ORAL | Status: AC
  Administered 2014-05-22: 1 via ORAL
  Filled 2014-05-22: qty 1

## 2014-05-22 NOTE — ED Notes (Signed)
Per EMS: pt coming from home with c/o right knee pain. Pt ambulatory to ED room. Pt requesting pain medication for right knee pain. Pt seen here and [facility] for similar complaint. Pt A&Ox4, respirations equal and unlabored, skin warm and dry

## 2014-05-22 NOTE — ED Provider Notes (Signed)
Medical screening examination/treatment/procedure(s) were performed by non-physician practitioner and as supervising physician I was immediately available for consultation/collaboration.   EKG Interpretation None        Enis Leatherwood, MD 05/22/14 0655 

## 2014-05-22 NOTE — ED Provider Notes (Signed)
CSN: [id number]     Arrival date & time 05/22/14  0315 History   First MD Initiated Contact with Patient 05/22/14 [phone]     Chief Complaint  Patient presents with  . Knee Pain   HPI  History provided by the patient and previous medical charts. Patient is a 48 year old female with history of hypertension presenting with persistent and continued right knee pain. She has been evaluated in the emergency department previously for similar right knee pain. She denies any new injury or trauma but states her knee frequently has sharp pains and "gives out". She denies any recent fall. Patient is homeless and stands and walks frequently. She has not followed up with any primary care provider orthopedic specialist. She was using a knee immobilizer and crutches previously but states that she no longer has these. Denies any weakness or numbness in the foot.    Past Medical History  Diagnosis Date  . No pertinent past medical history   . Hypertension   . GERD (gastroesophageal reflux disease)   . Alcohol abuse   . Homelessness    Past Surgical History  Procedure Laterality Date  . Cesarean section     History reviewed. No pertinent family history. History  Substance Use Topics  . Smoking status: Current Every Day Smoker -- 0.25 packs/day    Types: Cigarettes  . Smokeless tobacco: Not on file  . Alcohol Use: Yes     Comment: 2 40 oz weekly   OB History   Grav Para Term Preterm Abortions TAB SAB Ect Mult Living                 Review of Systems  All other systems reviewed and are negative.     Allergies  Ibuprofen and Penicillins  Home Medications   Prior to Admission medications   Medication Sig Start Date End Date Taking? Authorizing Provider  albuterol (PROVENTIL HFA;VENTOLIN HFA) 108 (90 BASE) MCG/ACT inhaler Inhale 1 puff into the lungs every 6 (six) hours as needed for wheezing or shortness of breath.    Historical Provider, MD  diphenhydrAMINE (BENADRYL) 25 MG tablet Take 1  tablet (25 mg total) by mouth every 6 (six) hours. 03/15/14   Angus Seller, PA-C  HYDROcodone-acetaminophen (NORCO/VICODIN) 5-325 MG per tablet Take 1 tablet by mouth every 6 (six) hours as needed for moderate pain or severe pain. 04/01/14   Roxy Horseman, PA-C  meloxicam (MOBIC) 15 MG tablet Take 1 tablet (15 mg total) by mouth daily. 05/22/14   Phill Mutter Ashunti Schofield, PA-C  omeprazole (PRILOSEC) 20 MG capsule Take 20 mg by mouth 2 (two) times daily before a meal.    Historical Provider, MD  traMADol (ULTRAM) 50 MG tablet Take 1 tablet (50 mg total) by mouth every 6 (six) hours as needed. 04/19/14   Dione Booze, MD   BP 116/65  Pulse 81  Temp(Src) 98.7 F (37.1 C) (Oral)  Resp 16  SpO2 98%  LMP 10/15/2013 Physical Exam  Nursing note and vitals reviewed. Constitutional: She is oriented to person, place, and time. She appears well-developed and well-nourished. No distress.  HENT:  Head: Normocephalic.  Cardiovascular: Normal rate and regular rhythm.   Pulmonary/Chest: Effort normal and breath sounds normal. No respiratory distress.  Musculoskeletal: She exhibits edema and tenderness.  Slightly reduced range of motion of the right knee but nearly full. She does have pain with flexion. There is mild swelling around the right knee and tenderness diffusely on palpation. No gross deformities. No  increased laxity with obvious varus stress. Negative anterior posterior drawer test. Normal dorsal pedal pulses and strength in the foot.  Neurological: She is alert and oriented to person, place, and time.  Skin: Skin is warm and dry. No rash noted.  Psychiatric: She has a normal mood and affect. Her behavior is normal.    ED Course  Procedures   COORDINATION OF CARE:  Nursing notes reviewed. Vital signs reviewed. Initial pt interview and examination performed.   Filed Vitals:   05/22/14 0321  BP: 116/65  Pulse: 81  Temp: 98.7 F (37.1 C)  TempSrc: Oral  Resp: 16  SpO2: 98%    3:30AM-patient  seen and evaluated. No new history of injury or trauma. Has history of chronic pains the same right knee. Was seen previously for similar symptoms in the emergency room. Previous x-rays reviewed unremarkable. No indication for repeat x-rays today. Patient is requesting crutches but has received crutches on her last 2 emergency room visits. She has not followed up with a primary care provider or her orthopedic specialist. At this point I told her that she may purchase crutches from a health supply store herself or followup with a specialist as we have already given her crutches at all times. Will provide an Ace wrap. Prescription for Mobic provided.   Treatment plan initiated: Medications  HYDROcodone-acetaminophen (NORCO/VICODIN) 5-325 MG per tablet 1 tablet (1 tablet Oral Given 05/22/14 0348)         MDM   Final diagnoses:  Knee pain, chronic, right       Angus Seller, PA-C 05/22/14 [phone]

## 2014-05-22 NOTE — Discharge Instructions (Signed)
Please follow up with a primary care provider or orthopedic specialist for continued evaluation and treatment.    Knee Pain The knee is the complex joint between your thigh and your lower leg. It is made up of bones, tendons, ligaments, and cartilage. The bones that make up the knee are:  The femur in the thigh.  The tibia and fibula in the lower leg.  The patella or kneecap riding in the groove on the lower femur. CAUSES  Knee pain is a common complaint with many causes. A few of these causes are:  Injury, such as:  A ruptured ligament or tendon injury.  Torn cartilage.  Medical conditions, such as:  Gout  Arthritis  Infections  Overuse, over training, or overdoing a physical activity. Knee pain can be minor or severe. Knee pain can accompany debilitating injury. Minor knee problems often respond well to self-care measures or get well on their own. More serious injuries may need medical intervention or even surgery. SYMPTOMS The knee is complex. Symptoms of knee problems can vary widely. Some of the problems are:  Pain with movement and weight bearing.  Swelling and tenderness.  Buckling of the knee.  Inability to straighten or extend your knee.  Your knee locks and you cannot straighten it.  Warmth and redness with pain and fever.  Deformity or dislocation of the kneecap. DIAGNOSIS  Determining what is wrong may be very straight forward such as when there is an injury. It can also be challenging because of the complexity of the knee. Tests to make a diagnosis may include:  Your caregiver taking a history and doing a physical exam.  Routine X-rays can be used to rule out other problems. X-rays will not reveal a cartilage tear. Some injuries of the knee can be diagnosed by:  Arthroscopy a surgical technique by which a small video camera is inserted through tiny incisions on the sides of the knee. This procedure is used to examine and repair internal knee joint  problems. Tiny instruments can be used during arthroscopy to repair the torn knee cartilage (meniscus).  Arthrography is a radiology technique. A contrast liquid is directly injected into the knee joint. Internal structures of the knee joint then become visible on X-ray film.  An MRI scan is a non X-ray radiology procedure in which magnetic fields and a computer produce two- or three-dimensional images of the inside of the knee. Cartilage tears are often visible using an MRI scanner. MRI scans have largely replaced arthrography in diagnosing cartilage tears of the knee.  Blood work.  Examination of the fluid that helps to lubricate the knee joint (synovial fluid). This is done by taking a sample out using a needle and a syringe. TREATMENT The treatment of knee problems depends on the cause. Some of these treatments are:  Depending on the injury, proper casting, splinting, surgery, or physical therapy care will be needed.  Give yourself adequate recovery time. Do not overuse your joints. If you begin to get sore during workout routines, back off. Slow down or do fewer repetitions.  For repetitive activities such as cycling or running, maintain your strength and nutrition.  Alternate muscle groups. For example, if you are a weight lifter, work the upper body on one day and the lower body the next.  Either tight or weak muscles do not give the proper support for your knee. Tight or weak muscles do not absorb the stress placed on the knee joint. Keep the muscles surrounding the knee  strong.  Take care of mechanical problems.  If you have flat feet, orthotics or special shoes may help. See your caregiver if you need help.  Arch supports, sometimes with wedges on the inner or outer aspect of the heel, can help. These can shift pressure away from the side of the knee most bothered by osteoarthritis.  A brace called an "unloader" brace also may be used to help ease the pressure on the most  arthritic side of the knee.  If your caregiver has prescribed crutches, braces, wraps or ice, use as directed. The acronym for this is PRICE. This means protection, rest, ice, compression, and elevation.  Nonsteroidal anti-inflammatory drugs (NSAIDs), can help relieve pain. But if taken immediately after an injury, they may actually increase swelling. Take NSAIDs with food in your stomach. Stop them if you develop stomach problems. Do not take these if you have a history of ulcers, stomach pain, or bleeding from the bowel. Do not take without your caregiver's approval if you have problems with fluid retention, heart failure, or kidney problems.  For ongoing knee problems, physical therapy may be helpful.  Glucosamine and chondroitin are over-the-counter dietary supplements. Both may help relieve the pain of osteoarthritis in the knee. These medicines are different from the usual anti-inflammatory drugs. Glucosamine may decrease the rate of cartilage destruction.  Injections of a corticosteroid drug into your knee joint may help reduce the symptoms of an arthritis flare-up. They may provide pain relief that lasts a few months. You may have to wait a few months between injections. The injections do have a small increased risk of infection, water retention, and elevated blood sugar levels.  Hyaluronic acid injected into damaged joints may ease pain and provide lubrication. These injections may work by reducing inflammation. A series of shots may give relief for as long as 6 months.  Topical painkillers. Applying certain ointments to your skin may help relieve the pain and stiffness of osteoarthritis. Ask your pharmacist for suggestions. Many over the-counter products are approved for temporary relief of arthritis pain.  In some countries, doctors often prescribe topical NSAIDs for relief of chronic conditions such as arthritis and tendinitis. A review of treatment with NSAID creams found that they  worked as well as oral medications but without the serious side effects. PREVENTION  Maintain a healthy weight. Extra pounds put more strain on your joints.  Get strong, stay limber. Weak muscles are a common cause of knee injuries. Stretching is important. Include flexibility exercises in your workouts.  Be smart about exercise. If you have osteoarthritis, chronic knee pain or recurring injuries, you may need to change the way you exercise. This does not mean you have to stop being active. If your knees ache after jogging or playing basketball, consider switching to swimming, water aerobics, or other low-impact activities, at least for a few days a week. Sometimes limiting high-impact activities will provide relief.  Make sure your shoes fit well. Choose footwear that is right for your sport.  Protect your knees. Use the proper gear for knee-sensitive activities. Use kneepads when playing volleyball or laying carpet. Buckle your seat belt every time you drive. Most shattered kneecaps occur in car accidents.  Rest when you are tired. SEEK MEDICAL CARE IF:  You have knee pain that is continual and does not seem to be getting better.  SEEK IMMEDIATE MEDICAL CARE IF:  Your knee joint feels hot to the touch and you have a high fever. MAKE SURE YOU:  Understand these instructions.  Will watch your condition.  Will get help right away if you are not doing well or get worse. Document Released: 06/30/2007 Document Revised: 11/25/2011 Document Reviewed: 06/30/2007 [facility] Patient Information 2015 [place], [place]. This information is not intended to replace advice given to you by your health care provider. Make sure you discuss any questions you have with your health care provider.

## 2014-05-24 ENCOUNTER — Emergency Department (HOSPITAL_COMMUNITY): Payer: Medicaid Other

## 2014-05-24 ENCOUNTER — Observation Stay (HOSPITAL_COMMUNITY)
Admission: EM | Admit: 2014-05-24 | Discharge: 2014-05-25 | Disposition: A | Discharge status: Home / Self Care | Payer: Medicaid Other | Attending: Cardiovascular Disease | Admitting: Cardiovascular Disease

## 2014-05-24 ENCOUNTER — Encounter (HOSPITAL_COMMUNITY): Payer: Self-pay | Admitting: Emergency Medicine

## 2014-05-24 DIAGNOSIS — Z23 Encounter for immunization: Secondary | ICD-10-CM | POA: Insufficient documentation

## 2014-05-24 DIAGNOSIS — Y939 Activity, unspecified: Secondary | ICD-10-CM | POA: Diagnosis not present

## 2014-05-24 DIAGNOSIS — Y929 Unspecified place or not applicable: Secondary | ICD-10-CM | POA: Insufficient documentation

## 2014-05-24 DIAGNOSIS — S79929A Unspecified injury of unspecified thigh, initial encounter: Secondary | ICD-10-CM | POA: Diagnosis not present

## 2014-05-24 DIAGNOSIS — Z5900 Homelessness unspecified: Secondary | ICD-10-CM | POA: Insufficient documentation

## 2014-05-24 DIAGNOSIS — Z791 Long term (current) use of non-steroidal anti-inflammatories (NSAID): Secondary | ICD-10-CM | POA: Insufficient documentation

## 2014-05-24 DIAGNOSIS — S8990XA Unspecified injury of unspecified lower leg, initial encounter: Secondary | ICD-10-CM | POA: Insufficient documentation

## 2014-05-24 DIAGNOSIS — K219 Gastro-esophageal reflux disease without esophagitis: Secondary | ICD-10-CM | POA: Diagnosis not present

## 2014-05-24 DIAGNOSIS — Z880 Allergy status to penicillin: Secondary | ICD-10-CM | POA: Diagnosis not present

## 2014-05-24 DIAGNOSIS — S0993XA Unspecified injury of face, initial encounter: Secondary | ICD-10-CM | POA: Diagnosis not present

## 2014-05-24 DIAGNOSIS — Z3202 Encounter for pregnancy test, result negative: Secondary | ICD-10-CM | POA: Diagnosis not present

## 2014-05-24 DIAGNOSIS — S99919A Unspecified injury of unspecified ankle, initial encounter: Secondary | ICD-10-CM | POA: Diagnosis not present

## 2014-05-24 DIAGNOSIS — R296 Repeated falls: Secondary | ICD-10-CM | POA: Diagnosis not present

## 2014-05-24 DIAGNOSIS — I10 Essential (primary) hypertension: Secondary | ICD-10-CM | POA: Diagnosis not present

## 2014-05-24 DIAGNOSIS — IMO0002 Reserved for concepts with insufficient information to code with codable children: Secondary | ICD-10-CM | POA: Diagnosis not present

## 2014-05-24 DIAGNOSIS — S199XXA Unspecified injury of neck, initial encounter: Secondary | ICD-10-CM

## 2014-05-24 DIAGNOSIS — S0990XA Unspecified injury of head, initial encounter: Principal | ICD-10-CM | POA: Insufficient documentation

## 2014-05-24 DIAGNOSIS — F17200 Nicotine dependence, unspecified, uncomplicated: Secondary | ICD-10-CM | POA: Insufficient documentation

## 2014-05-24 DIAGNOSIS — S99929A Unspecified injury of unspecified foot, initial encounter: Secondary | ICD-10-CM

## 2014-05-24 DIAGNOSIS — R55 Syncope and collapse: Secondary | ICD-10-CM | POA: Insufficient documentation

## 2014-05-24 DIAGNOSIS — S79919A Unspecified injury of unspecified hip, initial encounter: Secondary | ICD-10-CM | POA: Insufficient documentation

## 2014-05-24 LAB — RAPID URINE DRUG SCREEN, HOSP PERFORMED
AMPHETAMINES: NOT DETECTED
BARBITURATES: NOT DETECTED
BENZODIAZEPINES: NOT DETECTED
Cocaine: NOT DETECTED
Opiates: NOT DETECTED
Tetrahydrocannabinol: NOT DETECTED

## 2014-05-24 LAB — CBC WITH DIFFERENTIAL/PLATELET
BASOS ABS: 0 10*3/uL (ref 0.0–0.1)
Basophils Relative: 0 % (ref 0–1)
EOS ABS: 0.2 10*3/uL (ref 0.0–0.7)
EOS PCT: 3 % (ref 0–5)
HCT: 41.4 % (ref 36.0–46.0)
HEMOGLOBIN: 13.4 g/dL (ref 12.0–15.0)
Lymphocytes Relative: 20 % (ref 12–46)
Lymphs Abs: 1.2 10*3/uL (ref 0.7–4.0)
MCH: 24.2 pg — ABNORMAL LOW (ref 26.0–34.0)
MCHC: 32.4 g/dL (ref 30.0–36.0)
MCV: 74.9 fL — AB (ref 78.0–100.0)
MONO ABS: 0.4 10*3/uL (ref 0.1–1.0)
MONOS PCT: 7 % (ref 3–12)
Neutro Abs: 4.1 10*3/uL (ref 1.7–7.7)
Neutrophils Relative %: 70 % (ref 43–77)
Platelets: 221 10*3/uL (ref 150–400)
RBC: 5.53 MIL/uL — ABNORMAL HIGH (ref 3.87–5.11)
RDW: 15 % (ref 11.5–15.5)
WBC: 5.5 10*3/uL (ref 4.0–10.5)

## 2014-05-24 LAB — URINALYSIS, ROUTINE W REFLEX MICROSCOPIC
Bilirubin Urine: NEGATIVE
GLUCOSE, UA: NEGATIVE mg/dL
HGB URINE DIPSTICK: NEGATIVE
KETONES UR: NEGATIVE mg/dL
Leukocytes, UA: NEGATIVE
Nitrite: NEGATIVE
PH: 5 (ref 5.0–8.0)
Protein, ur: NEGATIVE mg/dL
Specific Gravity, Urine: 1.019 (ref 1.005–1.030)
Urobilinogen, UA: 0.2 mg/dL (ref 0.0–1.0)

## 2014-05-24 LAB — I-STAT TROPONIN, ED: Troponin i, poc: 0 ng/mL (ref 0.00–0.08)

## 2014-05-24 LAB — CBC
HCT: 40.2 % (ref 36.0–46.0)
Hemoglobin: 13.3 g/dL (ref 12.0–15.0)
MCH: 24.7 pg — AB (ref 26.0–34.0)
MCHC: 33.1 g/dL (ref 30.0–36.0)
MCV: 74.6 fL — ABNORMAL LOW (ref 78.0–100.0)
PLATELETS: 212 10*3/uL (ref 150–400)
RBC: 5.39 MIL/uL — AB (ref 3.87–5.11)
RDW: 15.1 % (ref 11.5–15.5)
WBC: 6.1 10*3/uL (ref 4.0–10.5)

## 2014-05-24 LAB — BASIC METABOLIC PANEL
Anion gap: 15 (ref 5–15)
BUN: 12 mg/dL (ref 6–23)
CALCIUM: 8.9 mg/dL (ref 8.4–10.5)
CO2: 25 meq/L (ref 19–32)
Chloride: 100 mEq/L (ref 96–112)
Creatinine, Ser: 0.69 mg/dL (ref 0.50–1.10)
GFR calc Af Amer: >90 mL/min (ref >90)
GLUCOSE: 68 mg/dL — AB (ref 70–99)
Potassium: 4.3 mEq/L (ref 3.7–5.3)
SODIUM: 140 meq/L (ref 137–147)

## 2014-05-24 LAB — CREATININE, SERUM
CREATININE: 0.64 mg/dL (ref 0.50–1.10)
GFR calc Af Amer: >90 mL/min (ref >90)
GFR calc non Af Amer: >90 mL/min (ref >90)

## 2014-05-24 LAB — TROPONIN I: Troponin I: <0.3 ng/mL (ref <0.30)

## 2014-05-24 LAB — PREGNANCY, URINE: PREG TEST UR: NEGATIVE

## 2014-05-24 MED ORDER — ASPIRIN 81 MG PO CHEW
324.0000 mg | CHEWABLE_TABLET | Freq: Once | ORAL | Status: AC
  Administered 2014-05-24: 324 mg via ORAL
  Filled 2014-05-24: qty 4

## 2014-05-24 MED ORDER — ONDANSETRON HCL 4 MG/2ML IJ SOLN: 4.0000 mg | Freq: Four times a day (QID) | INTRAMUSCULAR | Status: DC | PRN

## 2014-05-24 MED ORDER — SODIUM CHLORIDE 0.9 % IJ SOLN
3.0000 mL | Freq: Two times a day (BID) | INTRAMUSCULAR | Status: DC
  Administered 2014-05-24 – 2014-05-25 (×2): 3 mL via INTRAVENOUS

## 2014-05-24 MED ORDER — ACETAMINOPHEN 650 MG RE SUPP: 650.0000 mg | Freq: Four times a day (QID) | RECTAL | Status: DC | PRN

## 2014-05-24 MED ORDER — PNEUMOCOCCAL VAC POLYVALENT 25 MCG/0.5ML IJ INJ
0.5000 mL | INJECTION | INTRAMUSCULAR | Status: AC
  Administered 2014-05-25: 0.5 mL via INTRAMUSCULAR
  Filled 2014-05-24: qty 0.5

## 2014-05-24 MED ORDER — INFLUENZA VAC SPLIT QUAD 0.5 ML IM SUSY
0.5000 mL | PREFILLED_SYRINGE | INTRAMUSCULAR | Status: AC
Start: 2014-05-25 — End: 2014-05-25
  Administered 2014-05-25: 0.5 mL via INTRAMUSCULAR
  Filled 2014-05-24: qty 0.5

## 2014-05-24 MED ORDER — HYDROCODONE-ACETAMINOPHEN 5-325 MG PO TABS
1.0000 | ORAL_TABLET | ORAL | Status: DC | PRN
  Administered 2014-05-25 (×2): 1 via ORAL
  Filled 2014-05-24 (×2): qty 1

## 2014-05-24 MED ORDER — ONDANSETRON HCL 4 MG PO TABS: 4.0000 mg | ORAL_TABLET | Freq: Four times a day (QID) | ORAL | Status: DC | PRN

## 2014-05-24 MED ORDER — HEPARIN SODIUM (PORCINE) 5000 UNIT/ML IJ SOLN
5000.0000 [IU] | Freq: Three times a day (TID) | INTRAMUSCULAR | Status: DC
  Administered 2014-05-24 – 2014-05-25 (×4): 5000 [IU] via SUBCUTANEOUS
  Filled 2014-05-24 (×4): qty 1

## 2014-05-24 MED ORDER — TETANUS-DIPHTH-ACELL PERTUSSIS 5-2.5-18.5 LF-MCG/0.5 IM SUSP
0.5000 mL | Freq: Once | INTRAMUSCULAR | Status: AC
  Administered 2014-05-24: 0.5 mL via INTRAMUSCULAR
  Filled 2014-05-24: qty 0.5

## 2014-05-24 MED ORDER — BACITRACIN ZINC 500 UNIT/GM EX OINT
TOPICAL_OINTMENT | Freq: Two times a day (BID) | CUTANEOUS | Status: DC
  Administered 2014-05-24 – 2014-05-25 (×2): via TOPICAL
  Filled 2014-05-24: qty 28.35

## 2014-05-24 MED ORDER — ACETAMINOPHEN 325 MG PO TABS
650.0000 mg | ORAL_TABLET | Freq: Four times a day (QID) | ORAL | Status: DC | PRN
  Administered 2014-05-24: 650 mg via ORAL
  Filled 2014-05-24: qty 2

## 2014-05-24 NOTE — ED Notes (Signed)
MD at bedside. Cardiologist at bedside.  
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
## 2014-05-25 DIAGNOSIS — S8990XA Unspecified injury of unspecified lower leg, initial encounter: Secondary | ICD-10-CM | POA: Diagnosis not present

## 2014-05-25 DIAGNOSIS — S0990XA Unspecified injury of head, initial encounter: Secondary | ICD-10-CM | POA: Diagnosis not present

## 2014-05-25 DIAGNOSIS — S0993XA Unspecified injury of face, initial encounter: Secondary | ICD-10-CM | POA: Diagnosis not present

## 2014-05-25 DIAGNOSIS — S79919A Unspecified injury of unspecified hip, initial encounter: Secondary | ICD-10-CM | POA: Diagnosis not present

## 2014-05-25 LAB — CBC
HCT: 39.2 % (ref 36.0–46.0)
Hemoglobin: 12.8 g/dL (ref 12.0–15.0)
MCH: 24.3 pg — AB (ref 26.0–34.0)
MCHC: 32.7 g/dL (ref 30.0–36.0)
MCV: 74.5 fL — AB (ref 78.0–100.0)
PLATELETS: 192 10*3/uL (ref 150–400)
RBC: 5.26 MIL/uL — ABNORMAL HIGH (ref 3.87–5.11)
RDW: 14.8 % (ref 11.5–15.5)
WBC: 4.1 10*3/uL (ref 4.0–10.5)

## 2014-05-25 LAB — TROPONIN I

## 2014-05-25 LAB — BASIC METABOLIC PANEL
Anion gap: 13 (ref 5–15)
BUN: 15 mg/dL (ref 6–23)
CO2: 27 mEq/L (ref 19–32)
Calcium: 8.8 mg/dL (ref 8.4–10.5)
Chloride: 97 mEq/L (ref 96–112)
Creatinine, Ser: 0.72 mg/dL (ref 0.50–1.10)
GLUCOSE: 117 mg/dL — AB (ref 70–99)
Potassium: 3.5 mEq/L — ABNORMAL LOW (ref 3.7–5.3)
Sodium: 137 mEq/L (ref 137–147)

## 2014-05-25 LAB — PROTIME-INR
INR: 1.1 (ref 0.00–1.49)
Prothrombin Time: 14.2 seconds (ref 11.6–15.2)

## 2014-05-25 MED ORDER — POTASSIUM CHLORIDE CRYS ER 10 MEQ PO TBCR
10.0000 meq | EXTENDED_RELEASE_TABLET | Freq: Two times a day (BID) | ORAL | Status: DC
Start: 2014-05-25 — End: 2014-05-25
  Administered 2014-05-25: 10 meq via ORAL
  Filled 2014-05-25 (×2): qty 1

## 2014-05-25 MED ORDER — POTASSIUM CHLORIDE CRYS ER 10 MEQ PO TBCR
10.0000 meq | EXTENDED_RELEASE_TABLET | Freq: Once | ORAL | Status: AC
  Administered 2014-05-25: 10 meq via ORAL
  Filled 2014-05-25: qty 1

## 2014-05-25 MED ORDER — BACITRACIN ZINC 500 UNIT/GM EX OINT: TOPICAL_OINTMENT | Freq: Two times a day (BID) | CUTANEOUS | Status: DC

## 2014-05-25 MED ORDER — HYDROCODONE-ACETAMINOPHEN 5-325 MG PO TABS: 1.0000 | ORAL_TABLET | ORAL | Status: DC | PRN

## 2014-05-25 NOTE — Evaluation (Signed)
Occupational Therapy Evaluation Patient Details Name: Amy Conway MRN: [id number] DOB: 01-01-1966 Today's Date: 05/25/2014    History of Present Illness 48 year old female presents to the emergency department with chief complaint of fall vs. syncope.  On admission, pt complained of headache, face pain, right hip, right knee, and right ankle pain.     Clinical Impression   Pt admitted with above. Feel pt will benefit from acute OT to increase independence and safety prior to d/c.     Follow Up Recommendations  No OT follow up;Supervision - Intermittent    Equipment Recommendations  3 in 1 bedside comode    Recommendations for Other Services Other (comment) (social work consult)     Precautions / Restrictions Precautions Precautions: Fall Restrictions Weight Bearing Restrictions: No      Mobility Bed Mobility Overal bed mobility: Modified Independent                Transfers Overall transfer level: Needs assistance   Transfers: Sit to/from Stand Sit to Stand: Supervision;Min guard         General transfer comment: cues for technique.    Balance                                            ADL Overall ADL's : Needs assistance/impaired     Grooming: Wash/dry face;Standing;Set up;Supervision/safety   Upper Body Bathing: Set up;Supervision/ safety;Standing   Lower Body Bathing: Min guard;Sit to/from stand       Lower Body Dressing: Set up;Supervision/safety;Sit to/from stand   Toilet Transfer: Min guard;Ambulation;RW;Regular Toilet   Toileting- Architect and Hygiene: Supervision/safety;Sit to/from stand       Functional mobility during ADLs: Min guard;Rolling walker (ambulated with and without walker) General ADL Comments: Educated on safety tips (safe shoewear, use of bag on walker, sitting for most of LB ADLs). Educated on tub transfer technique by backing to chair and swinging legs in and also stated she could sit  on edge of tub and swing legs in until she is able to step over. Educated on dressing technique.     Vision                     Perception     Praxis      Pertinent Vitals/Pain Pain Assessment: 0-10 Pain Score: 8  Pain Location: Right knee Pain Descriptors / Indicators: Aching;Throbbing Pain Intervention(s): Monitored during session     Hand Dominance     Extremity/Trunk Assessment Upper Extremity Assessment Upper Extremity Assessment: Generalized weakness   Lower Extremity Assessment Lower Extremity Assessment: Defer to PT evaluation       Communication Communication Communication: No difficulties   Cognition Arousal/Alertness: Awake/alert Behavior During Therapy: WFL for tasks assessed/performed Overall Cognitive Status: Within Functional Limits for tasks assessed                     General Comments       Exercises       Shoulder Instructions      Home Living Family/patient expects to be discharged to:: Shelter/Homeless                                 Additional Comments: stays with friends at times who have tub/shower      Prior  Functioning/Environment Level of Independence: Independent             OT Diagnosis: Acute pain;Generalized weakness   OT Problem List: Decreased strength;Impaired balance (sitting and/or standing);Pain;Decreased knowledge of use of DME or AE;Decreased knowledge of precautions   OT Treatment/Interventions: Self-care/ADL training;Therapeutic exercise;DME and/or AE instruction;Therapeutic activities;Patient/family education;Balance training    OT Goals(Current goals can be found in the care plan section) Acute Rehab OT Goals Patient Stated Goal: find housing OT Goal Formulation: With patient Time For Goal Achievement: 06/01/14 Potential to Achieve Goals: Good ADL Goals Pt Will Perform Lower Body Dressing: with modified independence;sit to/from stand Pt Will Transfer to Toilet: with  modified independence;ambulating  OT Frequency: Min 2X/week   Barriers to D/C:            Co-evaluation              End of Session Equipment Utilized During Treatment: Gait belt;Rolling walker  Activity Tolerance: Patient tolerated treatment well Patient left: in bed;with call bell/phone within reach;with bed alarm set;with family/visitor present   Time: 1110-1134 OT Time Calculation (min): 24 min Charges:  OT General Charges $OT Visit: 1 Procedure OT Evaluation $Initial OT Evaluation Tier I: 1 Procedure OT Treatments $Self Care/Home Management : 8-22 mins G-Codes: OT G-codes **NOT FOR INPATIENT CLASS** Functional Assessment Tool Used: clinical judgment Functional Limitation: Self care Self Care Current Status (Z6109): At least 1 percent but less than 20 percent impaired, limited or restricted Self Care Goal Status (U0454): 0 percent impaired, limited or restricted  Earlie Raveling OTR/L [phone] 05/25/2014, 12:02 PM

## 2014-05-25 NOTE — Evaluation (Signed)
Physical Therapy Evaluation Patient Details Name: Amy Conway MRN: [id number] DOB: [birth year] Today's Date: 05/25/2014   History of Present Illness  48 year old female presents to the emergency department with chief complaint of fall vs. syncope. Has had pain and swelling right knee with occasional buckling x2 mos, given KI in ED.    Clinical Impression  Pt admitted with gait abnormality and balance impairment with h/o falls due to right knee pain and swelling. Pt currently with functional limitations due to the deficits listed below (see PT Problem List). Pt can ambulate safely with RW but not best situation with homeless state. Recommend CSW consult to help with d/c issues. Pt would benefit from f/u with orthopedic MD for knee pain as well as OP PT. Pt given quad strengthening program and educated on proper positioning.  Pt will benefit from skilled PT to increase their independence and safety with mobility to allow discharge to the venue listed below. PT will continue to follow.       Follow Up Recommendations Outpatient PT    Equipment Recommendations  Rolling walker with 5" wheels    Recommendations for Other Services Other (comment) (CSW consult)     Precautions / Restrictions Precautions Precautions: Fall Precaution Comments: pt reports right knee has buckled several times leading to falling Restrictions Weight Bearing Restrictions: No      Mobility  Bed Mobility Overal bed mobility: Modified Independent                Transfers Overall transfer level: Needs assistance Equipment used: Rolling walker (2 wheeled) Transfers: Sit to/from Stand Sit to Stand: Supervision         General transfer comment: cues for technique.  Ambulation/Gait Ambulation/Gait assistance: Supervision Ambulation Distance (Feet): 300 Feet Assistive device: Rolling walker (2 wheeled) Gait Pattern/deviations: Decreased dorsiflexion - right;Decreased stance time - right;Decreased  weight shift to right Gait velocity: decreased   General Gait Details: worked on ambulation with appropriate heel strike and knee flexion with swing phase and quad contraction in stance. Pt avoided full wt-bearing on right with RW to manage pain   Stairs Stairs: Yes Stairs assistance: Supervision Stair Management: One rail Right;Forwards;Step to pattern Number of Stairs: 5 General stair comments: vc's for sequencing for pain mgmt, then educated pt in using stairs in opposite pattern for strengthening  Wheelchair Mobility    Modified Rankin (Stroke Patients Only)       Balance Overall balance assessment: Needs assistance Sitting-balance support: No upper extremity supported Sitting balance-Leahy Scale: Normal     Standing balance support: No upper extremity supported;During functional activity Standing balance-Leahy Scale: Fair Standing balance comment: decreased balance due to decreased WB'ing right LE                             Pertinent Vitals/Pain Pain Assessment: Faces Pain Score: 8  Faces Pain Scale: Hurts whole lot Pain Location: right knee Pain Descriptors / Indicators: Aching;Throbbing Pain Intervention(s): Limited activity within patient's tolerance    Home Living Family/patient expects to be discharged to:: Shelter/Homeless                 Additional Comments: stays with friends at times who have tub/shower    Prior Function Level of Independence: Independent               Hand Dominance        Extremity/Trunk Assessment   Upper Extremity Assessment: Defer to OT evaluation  Lower Extremity Assessment: RLE deficits/detail;LLE deficits/detail RLE Deficits / Details: knee ext 3/5, cannot maintain quad contraction against resistance due to pain, tenderness superior and inferior pole of patella LLE Deficits / Details: WFL  Cervical / Trunk Assessment: Normal  Communication   Communication: No difficulties   Cognition Arousal/Alertness: Awake/alert Behavior During Therapy: WFL for tasks assessed/performed Overall Cognitive Status: Within Functional Limits for tasks assessed                      General Comments General comments (skin integrity, edema, etc.): swelling right knee    Exercises Other Exercises Other Exercises: quad sets: 10x, 3x/ day Other Exercises: mini squats: 10x, 3x/ day, paying attention to keep knees aligned with toes Other Exercises: step downs, RLE high, when tolerated, beginning with 5 reps      Assessment/Plan    PT Assessment Patient needs continued PT services  PT Diagnosis Abnormality of gait;Difficulty walking;Acute pain;Generalized weakness   PT Problem List Decreased strength;Decreased activity tolerance;Decreased balance;Decreased mobility;Pain;Decreased knowledge of use of DME  PT Treatment Interventions DME instruction;Gait training;Stair training;Functional mobility training;Therapeutic activities;Therapeutic exercise;Balance training;Patient/family education;Neuromuscular re-education   PT Goals (Current goals can be found in the Care Plan section) Acute Rehab PT Goals Patient Stated Goal: find housing, get knee better PT Goal Formulation: With patient Time For Goal Achievement: 06/08/14 Potential to Achieve Goals: Good    Frequency Min 3X/week   Barriers to discharge Inaccessible home environment poor home situation, mostly homeless    Co-evaluation               End of Session Equipment Utilized During Treatment: Gait belt Activity Tolerance: Patient limited by pain Patient left: in bed;with call bell/phone within reach;with family/visitor present Nurse Communication: Mobility status    Functional Assessment Tool Used: clinical judgement Functional Limitation: Mobility: Walking and moving around Mobility: Walking and Moving Around Current Status [phone]): At least 20 percent but less than 40 percent impaired, limited or  restricted Mobility: Walking and Moving Around Goal Status [phone]): At least 1 percent but less than 20 percent impaired, limited or restricted    Time: 1439-1505 PT Time Calculation (min): 26 min   Charges:   PT Evaluation $Initial PT Evaluation Tier I: 1 Procedure PT Treatments $Gait Training: 8-22 mins $Therapeutic Exercise: 8-22 mins   PT G Codes:   Functional Assessment Tool Used: clinical judgement Functional Limitation: Mobility: Walking and moving around  [facility], PT  Acute Rehab Services  [phone]   Lyanne Co 05/25/2014, 3:38 PM

## 2014-05-25 NOTE — Discharge Summary (Signed)
Physician Discharge Summary  Patient ID: Amy Conway MRN: [id number] DOB/AGE: 04-10-1966 48 y.o.  Admit date: 05/24/2014 Discharge date: 05/25/2014  Admission Diagnoses: Syncope  Right knee pain and swelling.  Discharge Diagnoses:  Principle Problem: * Syncope *  Right knee pain and swelling. Alcohol use disorder  Discharged Condition: good  Hospital Course: 48 year old female presents to the emergency department with chief complaint of fall vs. syncope. She states that she is uncertain what happened. She is homeless. She complained of headache, face pain, right hip, right knee, and right ankle pain. She states that she thinks she passed out because she took meloxicam with alcohol. She does admit to drinking yesterday. She states pain is moderate. It is worsened with movement and palpation. She also has low blood sugars off and on as she misses meals from time to time. EKG shows SR with early repolarization. Her echocardiogram was normal and her cardiac enzymes were normal. She was advised to refrain from alcohol use and eat 3 to 4 regular meals.  Consults: cardiology  Significant Diagnostic Studies: labs: Near normal CBC, cardiac enzymes and BMET post potassium supplementation.  Treatments: analgesia: Vicodin  Discharge Exam: Blood pressure 141/85, pulse 65, temperature 98.7 F (37.1 C), temperature source Oral, resp. rate 16, height  (1.549 m), weight 73.1 kg (161 lb 2.5 oz), last menstrual period 10/15/2013, SpO2 100.00%. Physical Exam  Nursing note and vitals reviewed.  Constitutional: She is well-developed and well-nourished.  HENT: Normocephalic and atraumatic. Tenderness palpation over the nose, and maxilla, Abrasion to right forehead, and nose.  Eyes: Brown eyes, Conjunctivae and EOM are normal. Pupils are equal, round, and reactive to light.  Neck: Normal range of motion. Neck supple.  Cardiovascular: Normal rate and regular rhythm. Exam reveals no gallop and no  friction rub. No murmur heard.  Pulmonary/Chest: Effort normal and breath sounds normal. No respiratory distress.  Abdominal: Soft. Bowel sounds are normal. She exhibits no distension or tenderness.  Musculoskeletal: Normal range of motion. She exhibits no edema and no tenderness.  Tenderness to palpation over the right hip, right knee, and right ankle, there is moderate swelling about the right knee, range of motion and strength is reduced somewhat secondary to pain  Neurological: She is alert and oriented to person, place, and time.  Skin: Skin is warm and dry.  Psychiatric: She has a normal mood and affect. Her behavior is normal.    Disposition: 01-Home or Self Care     Medication List         bacitracin ointment  Apply topically 2 (two) times daily.     HYDROcodone-acetaminophen 5-325 MG per tablet  Commonly known as:  NORCO/VICODIN  Take 1 tablet by mouth every 4 (four) hours as needed for moderate pain.     meloxicam 15 MG tablet  Commonly known as:  MOBIC  Take 1 tablet (15 mg total) by mouth daily.           Follow-up Information   Follow up with [facility], MD. Schedule an appointment as soon as possible for a visit in 1 week.   Specialty:  Cardiology   Contact information:   [address] [place] [address] [phone]       Signed: Ricki Rodriguez 05/25/2014, 5:20 PM

## 2014-05-25 NOTE — Progress Notes (Signed)
Echocardiogram 2D Echocardiogram has been performed.  Amy Conway 05/25/2014, 1:38 PM

## 2014-05-25 NOTE — Discharge Instructions (Signed)
Finding Treatment for Alcohol and Drug Addiction It can be hard to find the right place to get professional treatment. Here are some important things to consider:  There are different types of treatment to choose from.  Some programs are live-in (residential) while others are not (outpatient). Sometimes a combination is offered.  No single type of program is right for everyone.  Most treatment programs involve a combination of education, counseling, and a 12-step, spiritually-based approach.  There are non-spiritually based programs (not 12-step).  Some treatment programs are government sponsored. They are geared for patients without private insurance.  Treatment programs can vary in many respects such as:  Cost and types of insurance accepted.  Types of on-site medical services offered.  Length of stay, setting, and size.  Overall philosophy of treatment. A person may need specialized treatment or have needs not addressed by all programs. For example, adolescents need treatment appropriate for their age. Other people have secondary disorders that must be managed as well. Secondary conditions can include mental illness, such as depression or diabetes. Often, a period of detoxification from alcohol or drugs is needed. This requires medical supervision and not all programs offer this. THINGS TO CONSIDER WHEN SELECTING A TREATMENT PROGRAM   Is the program certified by the appropriate government agency? Even private programs must be certified and employ certified professionals.  Does the program accept your insurance? If not, can a payment plan be set up?  Is the facility clean, organized, and well run? Do they allow you to speak with graduates who can share their treatment experience with you? Can you tour the facility? Can you meet with staff?  Does the program meet the full range of individual needs?  Does the treatment program address sexual orientation and physical disabilities?  Do they provide age, gender, and culturally appropriate treatment services?  Is treatment available in languages other than English?  Is long-term aftercare support or guidance encouraged and provided?  Is assessment of an individual's treatment plan ongoing to ensure it meets changing needs?  Does the program use strategies to encourage reluctant patients to remain in treatment long enough to increase the likelihood of success?  Does the program offer counseling (individual or group) and other behavioral therapies?  Does the program offer medicine as part of the treatment regimen, if needed?  Is there ongoing monitoring of possible relapse? Is there a defined relapse prevention program? Are services or referrals offered to family members to ensure they understand addiction and the recovery process? This would help them support the recovering individual.  Are 12-step meetings held at the center or is transport available for patients to attend outside meetings? In countries outside of the [place]. and [place], Magazine features editor for contact information for services in your area. Document Released: 08/01/2005 Document Revised: 11/25/2011 Document Reviewed: 02/11/2008 [facility] Patient Information 2015 [place], [place]. This information is not intended to replace advice given to you by your health care provider. Make sure you discuss any questions you have with your health care provider.   Cardiac Diet This diet can help prevent heart disease and stroke. Many factors influence your heart health, including eating and exercise habits. Coronary risk rises a lot with abnormal blood fat (lipid) levels. Cardiac meal planning includes limiting unhealthy fats, increasing healthy fats, and making other small dietary changes. General guidelines are as follows:  Adjust calorie intake to reach and maintain desirable body weight.  Limit total fat intake to less than 30% of total calories. Saturated  fat should  be less than 7% of calories.  Saturated fats are found in animal products and in some vegetable products. Saturated vegetable fats are found in coconut oil, cocoa butter, palm oil, and palm kernel oil. Read labels carefully to avoid these products as much as possible. Use butter in moderation. Choose tub margarines and oils that have 2 grams of fat or less. Good cooking oils are canola and olive oils.  Practice low-fat cooking techniques. Do not fry food. Instead, broil, bake, boil, steam, grill, roast on a rack, stir-fry, or microwave it. Other fat reducing suggestions include:  Remove the skin from poultry.  Remove all visible fat from meats.  Skim the fat off stews, soups, and gravies before serving them.  Steam vegetables in water or broth instead of sauting them in fat.  Avoid foods with trans fat (or hydrogenated oils), such as commercially fried foods and commercially baked goods. Commercial shortening and deep-frying fats will contain trans fat.  Increase intake of fruits, vegetables, whole grains, and legumes to replace foods high in fat.  Increase consumption of nuts, legumes, and seeds to at least 4 servings weekly. One serving of a legume equals  cup, and 1 serving of nuts or seeds equals  cup.  Choose whole grains more often. Have 3 servings per day (a serving is 1 ounce [oz]).  Eat 4 to 5 servings of vegetables per day. A serving of vegetables is 1 cup of raw leafy vegetables;  cup of raw or cooked cut-up vegetables;  cup of vegetable juice.  Eat 4 to 5 servings of fruit per day. A serving of fruit is 1 medium whole fruit;  cup of dried fruit;  cup of fresh, frozen, or canned fruit;  cup of 100% fruit juice.  Increase your intake of dietary fiber to 20 to 30 grams per day. Insoluble fiber may help lower your risk of heart disease and may help curb your appetite.  Soluble fiber binds cholesterol to be removed from the blood. Foods high in soluble fiber are dried beans,  citrus fruits, oats, apples, bananas, broccoli, Brussels sprouts, and eggplant.  Try to include foods fortified with plant sterols or stanols, such as yogurt, breads, juices, or margarines. Choose several fortified foods to achieve a daily intake of 2 to 3 grams of plant sterols or stanols.  Foods with omega-3 fats can help reduce your risk of heart disease. Aim to have a 3.5 oz portion of fatty fish twice per week, such as salmon, mackerel, albacore tuna, sardines, lake trout, or herring. If you wish to take a fish oil supplement, choose one that contains 1 gram of both DHA and EPA.  Limit processed meats to 2 servings (3 oz portion) weekly.  Limit the sodium in your diet to 1500 milligrams (mg) per day. If you have high blood pressure, talk to a registered dietitian about a DASH (Dietary Approaches to Stop Hypertension) eating plan.  Limit sweets and beverages with added sugar, such as soda, to no more than 5 servings per week. One serving is:   1 tablespoon sugar.  1 tablespoon jelly or jam.   cup sorbet.  1 cup lemonade.   cup regular soda. CHOOSING FOODS Starches  Allowed: Breads: All kinds (wheat, rye, raisin, white, oatmeal, [place], [place], and English muffin bread). Low-fat rolls: English muffins, frankfurter and hamburger buns, bagels, pita bread, tortillas (not fried). Pancakes, waffles, biscuits, and muffins made with recommended oil.  Avoid: Products made with saturated or trans fats,  oils, or whole milk products. Butter rolls, cheese breads, croissants. Commercial doughnuts, muffins, sweet rolls, biscuits, waffles, pancakes, store-bought mixes. Crackers  Allowed: Low-fat crackers and snacks: Animal, graham, rye, saltine (with recommended oil, no lard), oyster, and matzo crackers. Bread sticks, melba toast, rusks, flatbread, pretzels, and light popcorn.  Avoid: High-fat crackers: cheese crackers, butter crackers, and those made with coconut, palm oil, or trans fat  (hydrogenated oils). Buttered popcorn. Cereals  Allowed: Hot or cold whole-grain cereals.  Avoid: Cereals containing coconut, hydrogenated vegetable fat, or animal fat. Potatoes / Pasta / Rice  Allowed: All kinds of potatoes, rice, and pasta (such as macaroni, spaghetti, and noodles).  Avoid: Pasta or rice prepared with cream sauce or high-fat cheese. Chow mein noodles, [place] fries. Vegetables  Allowed: All vegetables and vegetable juices.  Avoid: Fried vegetables. Vegetables in cream, butter, or high-fat cheese sauces. Limit coconut. Fruit in cream or custard. Protein  Allowed: Limit your intake of meat, seafood, and poultry to no more than 6 oz (cooked weight) per day. All lean, well-trimmed beef, veal, pork, and lamb. All chicken and [place] without skin. All fish and shellfish. Wild game: wild duck, rabbit, pheasant, and venison. Egg whites or low-cholesterol egg substitutes may be used as desired. Meatless dishes: recipes with dried beans, peas, lentils, and tofu (soybean curd). Seeds and nuts: all seeds and most nuts.  Avoid: Prime grade and other heavily marbled and fatty meats, such as short ribs, spare ribs, rib eye roast or steak, frankfurters, sausage, bacon, and high-fat luncheon meats, mutton. Caviar. Commercially fried fish. Domestic duck, goose, venison sausage. Organ meats: liver, gizzard, heart, chitterlings, brains, kidney, sweetbreads. Dairy  Allowed: Low-fat cheeses: nonfat or low-fat cottage cheese (1% or 2% fat), cheeses made with part skim milk, such as mozzarella, farmers, string, or ricotta. (Cheeses should be labeled no more than 2 to 6 grams fat per oz.). Skim (or 1%) milk: liquid, powdered, or evaporated. Buttermilk made with low-fat milk. Drinks made with skim or low-fat milk or cocoa. Chocolate milk or cocoa made with skim or low-fat (1%) milk. Nonfat or low-fat yogurt.  Avoid: Whole milk cheeses, including colby, cheddar, [place], [address], [place], [place],  [place], [address], [place], and blue. Creamed cottage cheese, cream cheese. Whole milk and whole milk products, including buttermilk or yogurt made from whole milk, drinks made from whole milk. Condensed milk, evaporated whole milk, and 2% milk. Soups and Combination Foods  Allowed: Low-fat low-sodium soups: broth, dehydrated soups, homemade broth, soups with the fat removed, homemade cream soups made with skim or low-fat milk. Low-fat spaghetti, lasagna, chili, and Spanish rice if low-fat ingredients and low-fat cooking techniques are used.  Avoid: Cream soups made with whole milk, cream, or high-fat cheese. All other soups. Desserts and Sweets  Allowed: Sherbet, fruit ices, gelatins, meringues, and angel food cake. Homemade desserts with recommended fats, oils, and milk products. Jam, jelly, honey, marmalade, sugars, and syrups. Pure sugar candy, such as gum drops, hard candy, jelly beans, marshmallows, mints, and small amounts of dark chocolate.  Avoid: Commercially prepared cakes, pies, cookies, frosting, pudding, or mixes for these products. Desserts containing whole milk products, chocolate, coconut, lard, palm oil, or palm kernel oil. Ice cream or ice cream drinks. Candy that contains chocolate, coconut, butter, hydrogenated fat, or unknown ingredients. Buttered syrups. Fats and Oils  Allowed: Vegetable oils: safflower, sunflower, corn, soybean, cottonseed, sesame, canola, olive, or peanut. Non-hydrogenated margarines. Salad dressing or mayonnaise: homemade or commercial, made with a recommended oil. Low or nonfat  salad dressing or mayonnaise.  Limit added fats and oils to 6 to 8 tsp per day (includes fats used in cooking, baking, salads, and spreads on bread). Remember to count the "hidden fats" in foods.  Avoid: Solid fats and shortenings: butter, lard, salt pork, bacon drippings. Gravy containing meat fat, shortening, or suet. Cocoa butter, coconut. Coconut oil, palm oil, palm kernel oil,  or hydrogenated oils: these ingredients are often used in bakery products, nondairy creamers, whipped toppings, candy, and commercially fried foods. Read labels carefully. Salad dressings made of unknown oils, sour cream, or cheese, such as blue cheese and Roquefort. Cream, all kinds: half-and-half, light, heavy, or whipping. Sour cream or cream cheese (even if "light" or low-fat). Nondairy cream substitutes: coffee creamers and sour cream substitutes made with palm, palm kernel, hydrogenated oils, or coconut oil. Beverages  Allowed: Coffee (regular or decaffeinated), tea. Diet carbonated beverages, mineral water. Alcohol: Check with your caregiver. Moderation is recommended.  Avoid: Whole milk, regular sodas, and juice drinks with added sugar. Condiments  Allowed: All seasonings and condiments. Cocoa powder. "Cream" sauces made with recommended ingredients.  Avoid: Carob powder made with hydrogenated fats. SAMPLE MENU Breakfast   cup orange juice   cup oatmeal  1 slice toast  1 tsp margarine  1 cup skim milk Lunch  [place] sandwich with 2 oz [place], 2 slices bread  Lettuce and tomato slices  Fresh fruit  Carrot sticks  Coffee or tea Snack  Fresh fruit or low-fat crackers Dinner  3 oz lean ground beef  1 baked potato  1 tsp margarine   cup asparagus  Lettuce salad  1 tbs non-creamy dressing   cup peach slices  1 cup skim milk Document Released: 06/11/2008 Document Revised: 03/03/2012 Document Reviewed: 11/02/2013 [employer] Patient Information 2015 [place], [place]. This information is not intended to replace advice given to you by your health care provider. Make sure you discuss any questions you have with your health care provider.

## 2014-05-25 NOTE — Care Management Note (Signed)
    Page 1 of 1   05/25/2014     8:46:18 AM CARE MANAGEMENT NOTE 05/25/2014  Patient:  Amy Conway, Amy Conway   Account Number:  [id number]  Date Initiated:  05/25/2014  Documentation initiated by:  GRAVES-BIGELOW,Tajah Noguchi  Subjective/Objective Assessment:   Pt admitted for syncope, fall.     Action/Plan:   CM received referral for medication assistance. CM will be unable to assist due to pt has Medicaid. CM will ask CSW to assist with resources for housing.   Anticipated DC Date:  05/26/2014   Anticipated DC Plan:  HOME/SELF CARE  In-house referral  Clinical Social Worker      DC Planning Services  CM consult      Choice offered to / List presented to:             Status of service:  Completed, signed off Medicare Important Message given?  NO (If response is "NO", the following Medicare IM given date fields will be blank) Date Medicare IM given:   Medicare IM given by:   Date Additional Medicare IM given:   Additional Medicare IM given by:    Discharge Disposition:  HOME/SELF CARE  Per UR Regulation:  Reviewed for med. necessity/level of care/duration of stay  If discussed at Long Length of Stay Meetings, dates discussed:    Comments:

## 2014-05-25 NOTE — Progress Notes (Addendum)
INITIAL NUTRITION ASSESSMENT  DOCUMENTATION CODES Per approved criteria  -Not Applicable   INTERVENTION:  Snacks TID between meals.   NUTRITION DIAGNOSIS: Inadequate oral intake related to limited access to food as evidenced by 6% weight loss over the past 1.5 months.   Goal: Intake to meet >90% of estimated nutrition needs.  Monitor:  PO intake, labs, weight trend.  Reason for Assessment: MST  48 y.o. female  Admitting Dx: Syncope  ASSESSMENT: 48 year old female presented to the emergency department on 9/8 with chief complaint of fall vs. syncope.   Patient says that she is homeless and has not had access to food at various times over the past month. She says that she is a picky eater, does not like fruits. She says that she knows certain churches and facilities where she can get food, but she is unable to walk far due to her leg issues (fluid buildup and arthritis).  Patient requests snacks between meals. Has been eating okay since admission, [place] sandwich at bedside. Nutrition focused physical exam completed.  No muscle or subcutaneous fat depletion noticed.  Height: Ht Readings from Last 1 Encounters:  05/24/14  (1.549 m)    Weight: Wt Readings from Last 1 Encounters:  05/25/14 161 lb 2.5 oz (73.1 kg)    Ideal Body Weight: 47.7 kg  % Ideal Body Weight: 153%  Wt Readings from Last 10 Encounters:  05/25/14 161 lb 2.5 oz (73.1 kg)  04/01/14 171 lb (77.565 kg)  10/27/12 171 lb 9.6 oz (77.837 kg)  05/20/12 172 lb (78.019 kg)    Usual Body Weight: 171 lb  % Usual Body Weight: 94%  BMI:  Body mass index is 30.47 kg/(m^2).  Estimated Nutritional Needs: Kcal: 1500-1700 Protein: 75-85 gm Fluid: 1.5-1.7 L  Skin: face abrasion  Diet Order: Cardiac  EDUCATION NEEDS: -Education needs addressed   Intake/Output Summary (Last 24 hours) at 05/25/14 1032 Last data filed at 05/24/14 2138  Gross per 24 hour  Intake    360 ml  Output      0 ml  Net     360 ml    Last BM: 9/6   Labs:   Recent Labs Lab 05/24/14 1037 05/24/14 1404 05/25/14 0116  NA 140  --  137  K 4.3  --  3.5*  CL 100  --  97  CO2 25  --  27  BUN 12  --  15  CREATININE 0.69 0.64 0.72  CALCIUM 8.9  --  8.8  GLUCOSE 68*  --  117*    CBG (last 3)  No results found for this basename: GLUCAP,  in the last 72 hours  Scheduled Meds: . bacitracin   Topical BID  . heparin  5,000 Units Subcutaneous 3 times per day  . Influenza vac split quadrivalent PF  0.5 mL Intramuscular Tomorrow-1000  . pneumococcal 23 valent vaccine  0.5 mL Intramuscular Tomorrow-1000  . potassium chloride  10 mEq Oral BID  . sodium chloride  3 mL Intravenous Q12H    Continuous Infusions:   Past Medical History  Diagnosis Date  . Hypertension   . GERD (gastroesophageal reflux disease)   . Alcohol abuse   . Homelessness     Past Surgical History  Procedure Laterality Date  . Cesarean section  1997    Joaquin Courts, RD, LDN, CNSC Pager [phone] After Hours Pager [phone]

## 2014-05-25 NOTE — Progress Notes (Signed)
Utilization review completed.  

## 2014-05-26 NOTE — ED Provider Notes (Signed)
Medical screening examination/treatment/procedure(s) were performed by non-physician practitioner and as supervising physician I was immediately available for consultation/collaboration.   EKG Interpretation   Date/Time:  Tuesday May 24 2014 11:27:54 EDT Ventricular Rate:  72 PR Interval:  137 QRS Duration: 84 QT Interval:  403 QTC Calculation: 441 R Axis:   38 Text Interpretation:  Sinus rhythm ST elev, probable normal early repol  pattern Since last tracing ((03/22/14) ST abnormality is new Confirmed by  Effie Shy  MD, Steffany Schoenfelder [phone]) on 05/24/2014 11:43:40 AM       Flint Melter, MD 05/26/14 1413

## 2014-05-28 ENCOUNTER — Emergency Department (HOSPITAL_COMMUNITY)
Admission: EM | Admit: 2014-05-28 | Discharge: 2014-05-28 | Disposition: A | Discharge status: Home / Self Care | Payer: Medicaid Other | Attending: Emergency Medicine | Admitting: Emergency Medicine

## 2014-05-28 ENCOUNTER — Encounter (HOSPITAL_COMMUNITY): Payer: Self-pay | Admitting: Emergency Medicine

## 2014-05-28 DIAGNOSIS — S0990XA Unspecified injury of head, initial encounter: Secondary | ICD-10-CM | POA: Diagnosis present

## 2014-05-28 DIAGNOSIS — Z791 Long term (current) use of non-steroidal anti-inflammatories (NSAID): Secondary | ICD-10-CM | POA: Diagnosis not present

## 2014-05-28 DIAGNOSIS — I10 Essential (primary) hypertension: Secondary | ICD-10-CM | POA: Diagnosis not present

## 2014-05-28 DIAGNOSIS — IMO0002 Reserved for concepts with insufficient information to code with codable children: Secondary | ICD-10-CM | POA: Diagnosis not present

## 2014-05-28 DIAGNOSIS — Z5900 Homelessness unspecified: Secondary | ICD-10-CM | POA: Diagnosis not present

## 2014-05-28 DIAGNOSIS — Z880 Allergy status to penicillin: Secondary | ICD-10-CM | POA: Diagnosis not present

## 2014-05-28 DIAGNOSIS — F1010 Alcohol abuse, uncomplicated: Secondary | ICD-10-CM

## 2014-05-28 DIAGNOSIS — F17200 Nicotine dependence, unspecified, uncomplicated: Secondary | ICD-10-CM | POA: Diagnosis not present

## 2014-05-28 DIAGNOSIS — Z792 Long term (current) use of antibiotics: Secondary | ICD-10-CM | POA: Insufficient documentation

## 2014-05-28 NOTE — Discharge Instructions (Signed)
Alcohol Intoxication Amy Conway, you were seen today for alcohol abuse.  You were allowed to sober in the ED.  Refrain from drinking in the future and follow up with a primary doctor within 3 days for continued help.  If your symptoms worsen, return to the ED for repeat evaluation. Thank you. Alcohol intoxication occurs when you drink enough alcohol that it affects your ability to function. It can be mild or very severe. Drinking a lot of alcohol in a short time is called binge drinking. This can be very harmful. Drinking alcohol can also be more dangerous if you are taking medicines or other drugs. Some of the effects caused by alcohol may include:  Loss of coordination.  Changes in mood and behavior.  Unclear thinking.  Trouble talking (slurred speech).  Throwing up (vomiting).  Confusion.  Slowed breathing.  Twitching and shaking (seizures).  Loss of consciousness. HOME CARE  Do not drive after drinking alcohol.  Drink enough water and fluids to keep your pee (urine) clear or pale yellow. Avoid caffeine.  Only take medicine as told by your doctor. GET HELP IF:  You throw up (vomit) many times.  You do not feel better after a few days.  You frequently have alcohol intoxication. Your doctor can help decide if you should see a substance use treatment counselor. GET HELP RIGHT AWAY IF:  You become shaky when you stop drinking.  You have twitching and shaking.  You throw up blood. It may look bright red or like coffee grounds.  You notice blood in your poop (bowel movements).  You become lightheaded or pass out (faint). MAKE SURE YOU:   Understand these instructions.  Will watch your condition.  Will get help right away if you are not doing well or get worse. Document Released: 02/19/2008 Document Revised: 05/05/2013 Document Reviewed: 02/05/2013 [facility] Patient Information 2015 [place], [place]. This information is not intended to replace advice given to you by  your health care provider. Make sure you discuss any questions you have with your health care provider.
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
## 2014-06-12 ENCOUNTER — Emergency Department (HOSPITAL_COMMUNITY)
Admission: EM | Admit: 2014-06-12 | Discharge: 2014-06-12 | Disposition: A | Discharge status: Home / Self Care | Payer: Medicaid Other | Attending: Emergency Medicine | Admitting: Emergency Medicine

## 2014-06-12 ENCOUNTER — Encounter (HOSPITAL_COMMUNITY): Payer: Self-pay | Admitting: Emergency Medicine

## 2014-06-12 DIAGNOSIS — M25469 Effusion, unspecified knee: Secondary | ICD-10-CM | POA: Diagnosis not present

## 2014-06-12 DIAGNOSIS — Z792 Long term (current) use of antibiotics: Secondary | ICD-10-CM | POA: Diagnosis not present

## 2014-06-12 DIAGNOSIS — Z5900 Homelessness unspecified: Secondary | ICD-10-CM | POA: Insufficient documentation

## 2014-06-12 DIAGNOSIS — F17200 Nicotine dependence, unspecified, uncomplicated: Secondary | ICD-10-CM | POA: Diagnosis not present

## 2014-06-12 DIAGNOSIS — M7989 Other specified soft tissue disorders: Secondary | ICD-10-CM | POA: Insufficient documentation

## 2014-06-12 DIAGNOSIS — Z880 Allergy status to penicillin: Secondary | ICD-10-CM | POA: Insufficient documentation

## 2014-06-12 DIAGNOSIS — Z8719 Personal history of other diseases of the digestive system: Secondary | ICD-10-CM | POA: Diagnosis not present

## 2014-06-12 DIAGNOSIS — I10 Essential (primary) hypertension: Secondary | ICD-10-CM | POA: Insufficient documentation

## 2014-06-12 DIAGNOSIS — M25561 Pain in right knee: Secondary | ICD-10-CM

## 2014-06-12 DIAGNOSIS — M25461 Effusion, right knee: Secondary | ICD-10-CM

## 2014-06-12 MED ORDER — MELOXICAM 15 MG PO TABS: 15.0000 mg | ORAL_TABLET | Freq: Every day | ORAL | Status: DC

## 2014-06-12 MED ORDER — OXYCODONE-ACETAMINOPHEN 5-325 MG PO TABS
1.0000 | ORAL_TABLET | Freq: Once | ORAL | Status: AC
  Administered 2014-06-12: 1 via ORAL
  Filled 2014-06-12: qty 1

## 2014-06-12 NOTE — ED Notes (Signed)
Patient presents with c/o pain to the right knee +swelling noted.  Area does not seem hot. Able to move leg but with 10/10 pain

## 2014-06-12 NOTE — ED Provider Notes (Signed)
CSN: [id number]     Arrival date & time 06/12/14  0117 History   First MD Initiated Contact with Patient 06/12/14 0408     Chief Complaint  Patient presents with  . Leg Swelling    (Consider location/radiation/quality/duration/timing/severity/associated sxs/prior Treatment) HPI Comments: Patient is a 48 year old female with a history of hypertension, alcohol abuse, GERD, and homelessness. She presents to the emergency department for persistent right knee pain. Patient has been seen since July on 3 occasions for this complaint. Patient states that her symptoms have been worsening since onset. She states that she has had worsening swelling in that she is experiencing a constant, sharp pain that is nonradiating. Patient states the pain is improved with Vicodin, but she ran out of this medication. Patient denies any direct trauma or injury to her knee. Pain is worse with weightbearing and knee movement. Patient denies associated fever, loss of sensation, inability to ambulate, erythema, red streaking, history of cancer, and history of IV drug use. She states that she has not taken any anti-inflammatories because "I don't like to take pills".  The history is provided by the patient. No language interpreter was used.    Past Medical History  Diagnosis Date  . Hypertension   . GERD (gastroesophageal reflux disease)   . Alcohol abuse   . Homelessness    Past Surgical History  Procedure Laterality Date  . Cesarean section  1997   No family history on file. History  Substance Use Topics  . Smoking status: Current Every Day Smoker -- 1.00 packs/day for 29 years    Types: Cigarettes  . Smokeless tobacco: Never Used  . Alcohol Use: No     Comment: "quit drinking in ~ 2013"   OB History   Grav Para Term Preterm Abortions TAB SAB Ect Mult Living                  Review of Systems  Musculoskeletal: Positive for arthralgias and joint swelling.  All other systems reviewed and are  negative.   Allergies  Ibuprofen and Penicillins  Home Medications   Prior to Admission medications   Medication Sig Start Date End Date Taking? Authorizing Provider  bacitracin ointment Apply topically 2 (two) times daily. 05/25/14   Ricki Rodriguez, MD  HYDROcodone-acetaminophen (NORCO/VICODIN) 5-325 MG per tablet Take 1 tablet by mouth every 4 (four) hours as needed for moderate pain. 05/25/14   Ricki Rodriguez, MD  meloxicam (MOBIC) 15 MG tablet Take 1 tablet (15 mg total) by mouth daily. 06/12/14   Antony Madura, PA-C   BP 111/68  Pulse 81  Temp(Src) 98.7 F (37.1 C) (Oral)  Resp 21  Ht  (1.549 m)  Wt 160 lb (72.576 kg)  BMI 30.25 kg/m2  SpO2 97%  LMP 10/15/2013  Physical Exam  Nursing note and vitals reviewed. Constitutional: She is oriented to person, place, and time. She appears well-developed and well-nourished. No distress.  Nontoxic/nonseptic appearing  HENT:  Head: Normocephalic and atraumatic.  Eyes: Conjunctivae and EOM are normal. No scleral icterus.  Neck: Normal range of motion.  Cardiovascular: Normal rate, regular rhythm and intact distal pulses.   DP and PT pulses 2+ in right lower extremity  Pulmonary/Chest: Effort normal. No respiratory distress.  Musculoskeletal:       Right knee: She exhibits decreased range of motion, swelling and effusion. She exhibits no deformity, no erythema, normal alignment, no LCL laxity, no bony tenderness and no MCL laxity. Tenderness found.  Full passive  range of motion of right knee with decreased active range of motion secondary to pain. Patient has an effusion noted to her right knee, most of which is appreciated to be superior to the patella. No bony deformity or crepitus. No laxity.  Neurological: She is alert and oriented to person, place, and time. She exhibits normal muscle tone. Coordination normal.  Sensation to light touch intact in bilateral lower extremities. Patellar and Achilles reflexes 2+ in right lower  extremity.  Skin: Skin is warm and dry. No rash noted. She is not diaphoretic. No erythema. No pallor.  Psychiatric: She has a normal mood and affect. Her behavior is normal.    ED Course  Procedures (including critical care time) Labs Review Labs Reviewed - No data to display  Imaging Review 05/24/2014   CLINICAL DATA:  Fall.  Pain and swelling in the medial RIGHT knee.  EXAM: RIGHT KNEE - COMPLETE 4+ VIEW  COMPARISON:  04/01/2014.  FINDINGS: There is no interval change compared to the recent prior. No fracture. The alignment of the knee is anatomic. Mild lateral compartment osteoarthritis is present. Moderate knee effusion is evident on the lateral view which may be degenerative or posttraumatic.  IMPRESSION: 1. No acute abnormality. 2. Moderate knee effusion. 3. Mild lateral compartment osteoarthritis.   Electronically Signed   By: Andreas Newport M.D.   On: 05/24/2014 11:25    EKG Interpretation None      MDM   Final diagnoses:  Effusion of knee, right  Knee pain, right    48 year old female presents to the emergency department for persistent right knee pain. Patient has been evaluated on 3 separate occasions, prior to today, for this complaint. Patient denies any trauma or injury. No history of cancer or IV drug use. No fever or evidence of septic joint. Neurovascularly intact on exam. Joint effusion noted without any other complicating features.  Imaging reviewed which shows a moderate knee effusion with any other acute abnormalities. Patient to be given knee sleeve and ED. Have advised use of anti-inflammatories as well as ice, elevation, and rest. Patient given referral to orthopedics; she has yet to followup with orthopedics for further evaluation of her knee pain. Return precautions discussed and provided. Patient agreeable to plan with no unaddressed concerns.   Filed Vitals:   06/12/14 0145 06/12/14 0200 06/12/14 0228 06/12/14 0230  BP: 105/58 108/54 104/58 111/68  Pulse:  86 89 83 81  Temp:      TempSrc:      Resp: Height:      Weight:      SpO2: 94% 95% 99% 97%      Antony Madura, PA-C 06/12/14 [phone]

## 2014-06-12 NOTE — Discharge Instructions (Signed)
°Knee Effusion °The medical term for having fluid in your knee is effusion. This is often due to an internal derangement of the knee. This means something is wrong inside the knee. Some of the causes of fluid in the knee may be torn cartilage, a torn ligament, or bleeding into the joint from an injury. Your knee is likely more difficult to bend and move. This is often because there is increased pain and pressure in the joint. The time it takes for recovery from a knee effusion depends on different factors, including:  °· Type of injury. °· Your age. °· Physical and medical conditions. °· Rehabilitation Strategies. °How long you will be away from your normal activities will depend on what kind of knee problem you have and how much damage is present. Your knee has two types of cartilage. Articular cartilage covers the bone ends and lets your knee bend and move smoothly. Two menisci, thick pads of cartilage that form a rim inside the joint, help absorb shock and stabilize your knee. Ligaments bind the bones together and support your knee joint. Muscles move the joint, help support your knee, and take stress off the joint itself. °CAUSES  °Often an effusion in the knee is caused by an injury to one of the menisci. This is often a tear in the cartilage. Recovery after a meniscus injury depends on how much meniscus is damaged and whether you have damaged other knee tissue. Small tears may heal on their own with conservative treatment. Conservative means rest, limited weight bearing activity and muscle strengthening exercises. Your recovery may take up to 6 weeks.  °TREATMENT  °Larger tears may require surgery. Meniscus injuries may be treated during arthroscopy. Arthroscopy is a procedure in which your surgeon uses a small telescope like instrument to look in your knee. Your caregiver can make a more accurate diagnosis (learning what is wrong) by performing an arthroscopic procedure. °If your injury is on the inner  margin of the meniscus, your surgeon may trim the meniscus back to a smooth rim. In other cases your surgeon will try to repair a damaged meniscus with stitches (sutures). This may make rehabilitation take longer, but may provide better long term result by helping your knee keep its shock absorption capabilities. °Ligaments which are completely torn usually require surgery for repair. °HOME CARE INSTRUCTIONS °· Use crutches as instructed. °· If a brace is applied, use as directed. °· Once you are home, an ice pack applied to your swollen knee may help with discomfort and help decrease swelling. °· Keep your knee raised (elevated) when you are not up and around or on crutches. °· Only take over-the-counter or prescription medicines for pain, discomfort, or fever as directed by your caregiver. °· Your caregivers will help with instructions for rehabilitation of your knee. This often includes strengthening exercises. °· You may resume a normal diet and activities as directed. °SEEK MEDICAL CARE IF:  °· There is increased swelling in your knee. °· You notice redness, swelling, or increasing pain in your knee. °· An unexplained oral temperature above 102° F (38.9° C) develops. °SEEK IMMEDIATE MEDICAL CARE IF:  °· You develop a rash. °· You have difficulty breathing. °· You have any allergic reactions from medications you may have been given. °· There is severe pain with any motion of the knee. °MAKE SURE YOU:  °· Understand these instructions. °· Will watch your condition. °· Will get help right away if you are not doing well or get worse. °  Document Released: 11/23/2003 Document Revised: 11/25/2011 Document Reviewed: 01/27/2008 °[employer]® Patient Information ©2015 [employer]. This information is not intended to replace advice given to you by your health care provider. Make sure you discuss any questions you have with your health care provider. °RICE: Routine Care for Injuries °The routine care of many injuries  includes Rest, Ice, Compression, and Elevation (RICE). °HOME CARE INSTRUCTIONS °· Rest is needed to allow your body to heal. Routine activities can usually be resumed when comfortable. Injured tendons and bones can take up to 6 weeks to heal. Tendons are the cord-like structures that attach muscle to bone. °· Ice following an injury helps keep the swelling down and reduces pain. °¨ Put ice in a plastic bag. °¨ Place a towel between your skin and the bag. °¨ Leave the ice on for 15-20 minutes, 3-4 times a day, or as directed by your health care provider. Do this while awake, for the first 24 to 48 hours. After that, continue as directed by your caregiver. °· Compression helps keep swelling down. It also gives support and helps with discomfort. If an elastic bandage has been applied, it should be removed and reapplied every 3 to 4 hours. It should not be applied tightly, but firmly enough to keep swelling down. Watch fingers or toes for swelling, bluish discoloration, coldness, numbness, or excessive pain. If any of these problems occur, remove the bandage and reapply loosely. Contact your caregiver if these problems continue. °· Elevation helps reduce swelling and decreases pain. With extremities, such as the arms, hands, legs, and feet, the injured area should be placed near or above the level of the heart, if possible. °SEEK IMMEDIATE MEDICAL CARE IF: °· You have persistent pain and swelling. °· You develop redness, numbness, or unexpected weakness. °· Your symptoms are getting worse rather than improving after several days. °These symptoms may indicate that further evaluation or further X-rays are needed. Sometimes, X-rays may not show a small broken bone (fracture) until 1 week or 10 days later. Make a follow-up appointment with your caregiver. Ask when your X-ray results will be ready. Make sure you get your X-ray results. °Document Released: 12/15/2000 Document Revised: 09/07/2013 Document Reviewed:  02/01/2011 °[employer]® Patient Information ©2015 [employer]. This information is not intended to replace advice given to you by your health care provider. Make sure you discuss any questions you have with your health care provider. ° °

## 2014-06-12 NOTE — ED Provider Notes (Signed)
Medical screening examination/treatment/procedure(s) were performed by non-physician practitioner and as supervising physician I was immediately available for consultation/collaboration.   EKG Interpretation None       Zissel Biederman M Urvi Imes, MD 06/12/14 0619 

## 2014-07-21 ENCOUNTER — Emergency Department (HOSPITAL_COMMUNITY)
Admission: EM | Admit: 2014-07-21 | Discharge: 2014-07-21 | Disposition: A | Discharge status: Home / Self Care | Payer: Medicaid Other | Attending: Emergency Medicine | Admitting: Emergency Medicine

## 2014-07-21 ENCOUNTER — Encounter (HOSPITAL_COMMUNITY): Payer: Self-pay | Admitting: *Deleted

## 2014-07-21 DIAGNOSIS — M25561 Pain in right knee: Secondary | ICD-10-CM | POA: Insufficient documentation

## 2014-07-21 DIAGNOSIS — M25461 Effusion, right knee: Secondary | ICD-10-CM | POA: Insufficient documentation

## 2014-07-21 DIAGNOSIS — Z791 Long term (current) use of non-steroidal anti-inflammatories (NSAID): Secondary | ICD-10-CM | POA: Diagnosis not present

## 2014-07-21 DIAGNOSIS — Z720 Tobacco use: Secondary | ICD-10-CM | POA: Diagnosis not present

## 2014-07-21 DIAGNOSIS — I10 Essential (primary) hypertension: Secondary | ICD-10-CM | POA: Diagnosis not present

## 2014-07-21 DIAGNOSIS — Z590 Homelessness: Secondary | ICD-10-CM | POA: Diagnosis not present

## 2014-07-21 DIAGNOSIS — Z792 Long term (current) use of antibiotics: Secondary | ICD-10-CM | POA: Diagnosis not present

## 2014-07-21 DIAGNOSIS — Z8719 Personal history of other diseases of the digestive system: Secondary | ICD-10-CM | POA: Diagnosis not present

## 2014-07-21 DIAGNOSIS — Z880 Allergy status to penicillin: Secondary | ICD-10-CM | POA: Diagnosis not present

## 2014-07-21 MED ORDER — HYDROCODONE-ACETAMINOPHEN 5-325 MG PO TABS
1.0000 | ORAL_TABLET | Freq: Once | ORAL | Status: AC
  Administered 2014-07-21: 1 via ORAL
  Filled 2014-07-21: qty 1

## 2014-07-21 NOTE — ED Notes (Signed)
The pt has had rt knee pain for over a month.  She arrived by ptar ambulatory  lmp none

## 2014-07-21 NOTE — ED Provider Notes (Signed)
CSN: [id number]     Arrival date & time 07/21/14  0140 History   First MD Initiated Contact with Patient 07/21/14 0406     Chief Complaint  Patient presents with  . Knee Pain      HPI Patient states intermittent recurrent right knee pain.  Currently she's had pain over the last month.  She tried over-the-counter medications without improvement in her symptoms.  No recent injury or trauma.  Denies numbness or tingling.  No weakness.   Past Medical History  Diagnosis Date  . Hypertension   . GERD (gastroesophageal reflux disease)   . Alcohol abuse   . Homelessness    Past Surgical History  Procedure Laterality Date  . Cesarean section  1997   No family history on file. History  Substance Use Topics  . Smoking status: Current Every Day Smoker -- 1.00 packs/day for 29 years    Types: Cigarettes  . Smokeless tobacco: Never Used  . Alcohol Use: No     Comment: "quit drinking in ~ 2013"   OB History    No data available     Review of Systems  All other systems reviewed and are negative.     Allergies  Ibuprofen and Penicillins  Home Medications   Prior to Admission medications   Medication Sig Start Date End Date Taking? Authorizing Provider  bacitracin ointment Apply topically 2 (two) times daily. 05/25/14   Ricki RodriguezAjay S Kadakia, MD  HYDROcodone-acetaminophen (NORCO/VICODIN) 5-325 MG per tablet Take 1 tablet by mouth every 4 (four) hours as needed for moderate pain. 05/25/14   Ricki RodriguezAjay S Kadakia, MD  meloxicam (MOBIC) 15 MG tablet Take 1 tablet (15 mg total) by mouth daily. 06/12/14   Antony MaduraKelly Humes, PA-C   BP 143/99 mmHg  Pulse 72  Temp(Src) 97.6 F (36.4 C) (Oral)  Resp 16  Wt 164 lb (74.39 kg)  SpO2 100%  LMP 10/15/2013 Physical Exam  Constitutional: She is oriented to person, place, and time. She appears well-developed and well-nourished.  HENT:  Head: Normocephalic.  Eyes: EOM are normal.  Neck: Normal range of motion.  Pulmonary/Chest: Effort normal.  Abdominal: She  exhibits no distension.  Musculoskeletal: Normal range of motion.  Able arrange right knee.  Very small faint effusion noted.  Normal pulses in right foot.  No warmth or erythema of the right knee.  Full range of motion of right hip.  Neurological: She is alert and oriented to person, place, and time.  Psychiatric: She has a normal mood and affect.  Nursing note and vitals reviewed.   ED Course  Procedures (including critical care time) Labs Review Labs Reviewed - No data to display  Imaging Review No results found.   EKG Interpretation None      MDM   Final diagnoses:  Right knee pain    Small joint effusion.  Previously x-rays in the emergency department a been normal.  No injury or trauma.  No indication for imaging.  Doubt septic arthritis.    Lyanne CoKevin M Rosalinda Seaman, MD 07/21/14 [phone]

## 2014-07-25 ENCOUNTER — Encounter (HOSPITAL_COMMUNITY): Payer: Self-pay | Admitting: Emergency Medicine

## 2014-07-25 ENCOUNTER — Emergency Department (HOSPITAL_COMMUNITY)
Admission: EM | Admit: 2014-07-25 | Discharge: 2014-07-26 | Disposition: A | Discharge status: Home / Self Care | Payer: Medicaid Other | Attending: Emergency Medicine | Admitting: Emergency Medicine

## 2014-07-25 ENCOUNTER — Emergency Department (HOSPITAL_COMMUNITY): Payer: Medicaid Other

## 2014-07-25 DIAGNOSIS — Z720 Tobacco use: Secondary | ICD-10-CM | POA: Insufficient documentation

## 2014-07-25 DIAGNOSIS — Z8719 Personal history of other diseases of the digestive system: Secondary | ICD-10-CM | POA: Insufficient documentation

## 2014-07-25 DIAGNOSIS — F419 Anxiety disorder, unspecified: Secondary | ICD-10-CM | POA: Diagnosis not present

## 2014-07-25 DIAGNOSIS — R319 Hematuria, unspecified: Secondary | ICD-10-CM | POA: Insufficient documentation

## 2014-07-25 DIAGNOSIS — Z791 Long term (current) use of non-steroidal anti-inflammatories (NSAID): Secondary | ICD-10-CM | POA: Diagnosis not present

## 2014-07-25 DIAGNOSIS — I10 Essential (primary) hypertension: Secondary | ICD-10-CM | POA: Insufficient documentation

## 2014-07-25 DIAGNOSIS — Z3202 Encounter for pregnancy test, result negative: Secondary | ICD-10-CM | POA: Insufficient documentation

## 2014-07-25 DIAGNOSIS — Z590 Homelessness: Secondary | ICD-10-CM | POA: Diagnosis not present

## 2014-07-25 DIAGNOSIS — N898 Other specified noninflammatory disorders of vagina: Secondary | ICD-10-CM | POA: Diagnosis not present

## 2014-07-25 DIAGNOSIS — R079 Chest pain, unspecified: Secondary | ICD-10-CM | POA: Diagnosis not present

## 2014-07-25 DIAGNOSIS — R300 Dysuria: Secondary | ICD-10-CM | POA: Diagnosis not present

## 2014-07-25 DIAGNOSIS — Z792 Long term (current) use of antibiotics: Secondary | ICD-10-CM | POA: Insufficient documentation

## 2014-07-25 DIAGNOSIS — R350 Frequency of micturition: Secondary | ICD-10-CM | POA: Diagnosis present

## 2014-07-25 DIAGNOSIS — Z880 Allergy status to penicillin: Secondary | ICD-10-CM | POA: Insufficient documentation

## 2014-07-25 LAB — CBC
HEMATOCRIT: 41.2 % (ref 36.0–46.0)
Hemoglobin: 13.5 g/dL (ref 12.0–15.0)
MCH: 24.7 pg — ABNORMAL LOW (ref 26.0–34.0)
MCHC: 32.8 g/dL (ref 30.0–36.0)
MCV: 75.5 fL — ABNORMAL LOW (ref 78.0–100.0)
Platelets: 192 10*3/uL (ref 150–400)
RBC: 5.46 MIL/uL — AB (ref 3.87–5.11)
RDW: 15 % (ref 11.5–15.5)
WBC: 5 10*3/uL (ref 4.0–10.5)

## 2014-07-25 LAB — BASIC METABOLIC PANEL
ANION GAP: 15 (ref 5–15)
BUN: 11 mg/dL (ref 6–23)
CHLORIDE: 100 meq/L (ref 96–112)
CO2: 24 mEq/L (ref 19–32)
CREATININE: 0.66 mg/dL (ref 0.50–1.10)
Calcium: 8.9 mg/dL (ref 8.4–10.5)
GFR calc Af Amer: >90 mL/min (ref >90)
GFR calc non Af Amer: >90 mL/min (ref >90)
Glucose, Bld: 92 mg/dL (ref 70–99)
Potassium: 3.8 mEq/L (ref 3.7–5.3)
SODIUM: 139 meq/L (ref 137–147)

## 2014-07-25 LAB — I-STAT TROPONIN, ED: Troponin i, poc: 0 ng/mL (ref 0.00–0.08)

## 2014-07-25 NOTE — ED Notes (Addendum)
Pt was given 4 baby aspirin and 1 SL nitro by ems PTA.

## 2014-07-25 NOTE — ED Notes (Signed)
Pt c/o intermittent CP that started at 12 today. Pt also c/ol painful/frequent urination, abd pain and RT leg pain. Pt states she has been out of all her home meds x 5 months.

## 2014-07-25 NOTE — ED Provider Notes (Addendum)
CSN: [id number]     Arrival date & time 07/25/14  2224 History   First MD Initiated Contact with Patient 07/25/14 2230     Chief Complaint  Patient presents with  . Chest Pain  . Urinary Frequency  . Leg Pain     (Consider location/radiation/quality/duration/timing/severity/associated sxs/prior Treatment) HPI Comments: Patient noted 10 minutes of central CP lasting 10 minutes  Resolved spontaneously than recurred about 1 hour ago after receiving a stressful phone call.  This had resolved after receiving Nitro and ASA by EMS. Also reports no menses since January, but over the past 2 days has noted spotting and pain with urination.  She is sexually active without use of protection   Patient is a 48 y.o. female presenting with chest pain, frequency, and leg pain. The history is provided by the patient.  Chest Pain Pain location:  Substernal area Pain quality: aching   Pain radiates to:  Does not radiate Pain radiates to the back: no   Pain severity:  Mild Onset quality:  Unable to specify Duration:  10 minutes Timing:  Intermittent Progression:  Resolved (had 10 minutes of pain that resolved spontaneously recurred about 1 hours ago with stress phone call ) Chronicity:  New Context: stress   Relieved by:  Nitroglycerin and aspirin Worsened by:  Nothing tried Ineffective treatments:  None tried Associated symptoms: no abdominal pain, no cough, no fever, no nausea, no shortness of breath and not vomiting   Risk factors: hypertension   Urinary Frequency Associated symptoms include chest pain. Pertinent negatives include no abdominal pain, chills, coughing, fever, nausea, rash or vomiting.  Leg Pain Associated symptoms: no fever     Past Medical History  Diagnosis Date  . Hypertension   . GERD (gastroesophageal reflux disease)   . Alcohol abuse   . Homelessness    Past Surgical History  Procedure Laterality Date  . Cesarean section  1997   No family history on file. History   Substance Use Topics  . Smoking status: Current Every Day Smoker -- 1.00 packs/day for 29 years    Types: Cigarettes  . Smokeless tobacco: Never Used  . Alcohol Use: No     Comment: "quit drinking in ~ 2013"   OB History    No data available     Review of Systems  Constitutional: Negative for fever and chills.  Respiratory: Negative for cough and shortness of breath.   Cardiovascular: Positive for chest pain.  Gastrointestinal: Negative for nausea, vomiting, abdominal pain, diarrhea and constipation.  Genitourinary: Positive for dysuria, frequency, hematuria and vaginal discharge.  Skin: Negative for rash and wound.  All other systems reviewed and are negative.     Allergies  Ibuprofen and Penicillins  Home Medications   Prior to Admission medications   Medication Sig Start Date End Date Taking? Authorizing Provider  meloxicam (MOBIC) 15 MG tablet Take 1 tablet (15 mg total) by mouth daily. 06/12/14  Yes Antony MaduraKelly Humes, PA-C  bacitracin ointment Apply topically 2 (two) times daily. 05/25/14   Ricki RodriguezAjay S Kadakia, MD  HYDROcodone-acetaminophen (NORCO/VICODIN) 5-325 MG per tablet Take 1 tablet by mouth every 4 (four) hours as needed for moderate pain. 05/25/14   Ricki RodriguezAjay S Kadakia, MD   BP 114/74 mmHg  Pulse 68  Temp(Src) 97.6 F (36.4 C)  Resp 12  Ht 5\' 1"  (1.549 m)  Wt 165 lb (74.844 kg)  BMI 31.19 kg/m2  SpO2 97%  LMP 10/15/2013 Physical Exam  Constitutional: She is oriented to person,  place, and time. She appears well-developed.  HENT:  Head: Normocephalic.  Eyes: Pupils are equal, round, and reactive to light.  Neck: Normal range of motion.  Cardiovascular: Normal rate and regular rhythm.   Pulmonary/Chest: Effort normal.  Abdominal: Soft. She exhibits no distension. There is no tenderness.  Genitourinary:  refused  Musculoskeletal: Normal range of motion.  Neurological: She is alert and oriented to person, place, and time.  Skin: Skin is warm.  Psychiatric: She has a  normal mood and affect.    ED Course  Procedures (including critical care time) Labs Review Labs Reviewed  CBC - Abnormal; Notable for the following:    RBC 5.46 (*)    MCV 75.5 (*)    MCH 24.7 (*)    All other components within normal limits  GC/CHLAMYDIA PROBE AMP  BASIC METABOLIC PANEL  URINALYSIS, ROUTINE W REFLEX MICROSCOPIC  RPR  HIV ANTIBODY (ROUTINE TESTING)  I-STAT TROPOININ, ED  POC URINE PREG, ED  POC URINE PREG, ED    Imaging Review Dg Chest Port 1 View  07/25/2014   CLINICAL DATA:  Intermittent chest pain that began 10 hr ago.  EXAM: PORTABLE CHEST - 1 VIEW  COMPARISON:  None.  FINDINGS: The heart size and mediastinal contours are within normal limits. Both lungs are clear. The visualized skeletal structures are unremarkable.  IMPRESSION: No active disease.   Electronically Signed   By: Paulina FusiMark  Shogry M.D.   On: 07/25/2014 22:53     EKG Interpretation   Date/Time:  Monday July 25 2014 22:32:19 EST Ventricular Rate:  78 PR Interval:  145 QRS Duration: 80 QT Interval:  383 QTC Calculation: 436 R Axis:   43 Text Interpretation:  Sinus rhythm Consider left ventricular hypertrophy  ST elev, probable normal early repol pattern Baseline wander in lead(s) V1  V6 no change from ECG dated 05/25/14 Confirmed by DELOS  MD, DOUGLAS ([id number])  on 07/25/2014 11:49:39 PM    patinet is refusing pelvic examination  No cause for pain identified  Pain free without recurrence  MDM   Final diagnoses:  Anxiety  Dysuria         Arman FilterGail K Dynastee Brummell, NP 07/26/14 [id number]  Gwyneth SproutWhitney Plunkett, MD 07/26/14 1527  Arman FilterGail K Arna Luis, NP 08/09/14 2001  Gwyneth SproutWhitney Plunkett, MD 08/14/14 2258

## 2014-07-26 LAB — URINALYSIS, ROUTINE W REFLEX MICROSCOPIC
Bilirubin Urine: NEGATIVE
Glucose, UA: NEGATIVE mg/dL
HGB URINE DIPSTICK: NEGATIVE
Ketones, ur: NEGATIVE mg/dL
Leukocytes, UA: NEGATIVE
NITRITE: NEGATIVE
PROTEIN: NEGATIVE mg/dL
Specific Gravity, Urine: 1.015 (ref 1.005–1.030)
UROBILINOGEN UA: 0.2 mg/dL (ref 0.0–1.0)
pH: 5 (ref 5.0–8.0)

## 2014-07-26 LAB — POC URINE PREG, ED: PREG TEST UR: NEGATIVE

## 2014-07-26 LAB — RPR

## 2014-07-26 LAB — HIV ANTIBODY (ROUTINE TESTING W REFLEX): HIV: NONREACTIVE

## 2014-07-26 NOTE — ED Notes (Signed)
Pt a/o x 4 on d/c with steady gait. 

## 2014-07-26 NOTE — ED Notes (Signed)
Called main lab in regards to urinalysis results. ETA of results is 52 minutes.

## 2014-07-26 NOTE — Discharge Instructions (Signed)
Dysuria Dysuria is the medical term for pain with urination. There are many causes for dysuria, but urinary tract infection is the most common. If a urinalysis was performed it can show that there is a urinary tract infection. A urine culture confirms that you or your child is sick. You will need to follow up with a healthcare provider because:  If a urine culture was done you will need to know the culture results and treatment recommendations.  If the urine culture was positive, you or your child will need to be put on antibiotics or know if the antibiotics prescribed are the right antibiotics for your urinary tract infection.  If the urine culture is negative (no urinary tract infection), then other causes may need to be explored or antibiotics need to be stopped. Today laboratory work may have been done and there does not seem to be an infection. If cultures were done they will take at least 24 to 48 hours to be completed. Today x-rays may have been taken and they read as normal. No cause can be found for the problems. The x-rays may be re-read by a radiologist and you will be contacted if additional findings are made. You or your child may have been put on medications to help with this problem until you can see your primary caregiver. If the problems get better, see your primary caregiver if the problems return. If you were given antibiotics (medications which kill germs), take all of the mediations as directed for the full course of treatment.  If laboratory work was done, you need to find the results. Leave a telephone number where you can be reached. If this is not possible, make sure you find out how you are to get test results. HOME CARE INSTRUCTIONS   Drink lots of fluids. For adults, drink eight, 8 ounce glasses of clear juice or water a day. For children, replace fluids as suggested by your caregiver.  Empty the bladder often. Avoid holding urine for long periods of time.  After a bowel  movement, women should cleanse front to back, using each tissue only once.  Empty your bladder before and after sexual intercourse.  Take all the medicine given to you until it is gone. You may feel better in a few days, but TAKE ALL MEDICINE.  Avoid caffeine, tea, alcohol and carbonated beverages, because they tend to irritate the bladder.  In men, alcohol may irritate the prostate.  Only take over-the-counter or prescription medicines for pain, discomfort, or fever as directed by your caregiver.  If your caregiver has given you a follow-up appointment, it is very important to keep that appointment. Not keeping the appointment could result in a chronic or permanent injury, pain, and disability. If there is any problem keeping the appointment, you must call back to this facility for assistance. SEEK IMMEDIATE MEDICAL CARE IF:   Back pain develops.  A fever develops.  There is nausea (feeling sick to your stomach) or vomiting (throwing up).  Problems are no better with medications or are getting worse. MAKE SURE YOU:   Understand these instructions.  Will watch your condition.  Will get help right away if you are not doing well or get worse. Document Released: 05/31/2004 Document Revised: 11/25/2011 Document Reviewed: 04/07/2008 [facility] Patient Information 2015 [employer], [place]. This information is not intended to replace advice given to you by your health care provider. Make sure you discuss any questions you have with your health care provider. There is no cause  for your pain identified tonight Your do not have a UTI please follow up with PCP or [facility] GYN clinic  Emergency Department Resource Guide 1) Find a Doctor and Pay Out of Pocket Although you won't have to find out who is covered by your insurance plan, it is a good idea to ask around and get recommendations. You will then need to call the office and see if the doctor you have chosen will accept you as a new patient and  what types of options they offer for patients who are self-pay. Some doctors offer discounts or will set up payment plans for their patients who do not have insurance, but you will need to ask so you aren't surprised when you get to your appointment.  2) Contact Your Local Health Department Not all health departments have doctors that can see patients for sick visits, but many do, so it is worth a call to see if yours does. If you don't know where your local health department is, you can check in your phone book. The CDC also has a tool to help you locate your state's health department, and many state websites also have listings of all of their local health departments.  3) Find a Walk-in Clinic If your illness is not likely to be very severe or complicated, you may want to try a walk in clinic. These are popping up all over the country in pharmacies, drugstores, and shopping centers. They're usually staffed by nurse practitioners or physician assistants that have been trained to treat common illnesses and complaints. They're usually fairly quick and inexpensive. However, if you have serious medical issues or chronic medical problems, these are probably not your best option.  No Primary Care Doctor: - Call Health Connect at  [phone] - they can help you locate a primary care doctor that  accepts your insurance, provides certain services, etc. - Physician Referral Service- [phone]  Chronic Pain Problems: Organization         Address  Phone   Notes  [facility]  [phone] Patients need to be referred by their primary care doctor.   Medication Assistance: Organization         Address  Phone   Notes  [facility] Medication [facility] [address] [place] [place], [place] [address] [phone] --Must be a resident of [facility] -- Must have NO insurance coverage whatsoever (no Medicaid/ Medicare, etc.) -- The pt. MUST have a primary care doctor  that directs their care regularly and follows them in the community   [facility]  [phone]   [employer]  [phone]    Agencies that provide inexpensive medical care: Organization         Address  Phone   Notes  [facility]  [phone]   [facility]    [phone]   [facility] [address] [place], [place] [address] [phone]   [facility] [address] [place], [place] [phone]   [facility]anned Parenthood    [phone]   [facility]    [phone]   [facility]  [address], [place] Phone:  [phone], Fax:  [phone] Hours of Operation:  9 am - 6 pm, M-F.  Also accepts Medicaid/Medicare and self-pay.  [facility]  [address], [place] Phone: [phone], Fax: [phone])  [phone]. Hours of Operation:  8:30 am - 5:30 pm, M-F.  Also accepts Medicaid and self-pay.  [facility] [place] [address], [place] Phone: [phone]   [facility] [address] Natasha Bence [place], [place] [phone], Ext. [phone] Mondays & Thursdays: 7-9 AM.  First 15 patients are seen on a first come, first serve basis.    Medicaid-accepting [facility] Providers:  Organization         Address  Phone   Notes  [facility] [address], [place] [phone] Also accepts self-pay patients.  [facility] [address] Laurell Josephs [place], [place]  [phone]   [facility] [address], [place] [phone]   [facility] [address], [place] [phone]   Renaye Rakers [address], [place]   [phone] Only accepts [place] Access [place] patients after they have their name applied to their card.   Self-Pay (no insurance) in [facility]:  Organization          Address  Phone   Notes  Sickle Cell Patients, [facility] [address] [place], [place] [phone]   [facility] Urgent Care [address] [place], [place] [phone]   [facility] [place]  [address], [place] [phone]   [facility]/Dr. Osei-Bonsu  [address], [place] or [address], [place] [phone] Phone number for both [place] and [place] locations is the same.  [facility] [address], [place] [phone]   [facility] [address], [place] or [address] [phone]   [facility] [address], [place] [phone], phone; [phone], fax Sees patients 1st and 3rd Saturday of every month.  Must not qualify for public or private insurance (i.e. Medicaid, Medicare, [place] Health Choice, Veterans' Benefits)  Household income should be no more than 200% of the poverty level The clinic cannot treat you if you are pregnant or think you are pregnant  Sexually transmitted diseases are not treated at the clinic.    Dental Care: Organization         Address  Phone  Notes  [facility] Department of [facility] [address] [place], [place] [phone] Accepts children up to age 19 who are enrolled in [place] or [place] Health Choice; pregnant women with a Medicaid card; and children who have applied for Medicaid or [place] Health Choice, but were declined, whose parents can pay a reduced fee at time of service.  [facility] of [facility]  [address], [place] [phone] Accepts children up to age 4 who are enrolled in [place] or [place] Health Choice; pregnant women with a Medicaid card; and children who have applied for Medicaid or [place] Health Choice, but were declined, whose parents can pay a reduced fee at time of  service.  [facility]  [address] [place], [place] [phone] Patients are seen by appointment only. Walk-ins are not accepted. [facility] will see patients 4 years of age and older. Monday - Tuesday (8am-5pm) Most Wednesdays (8:30-5pm) $30 per visit, cash only  [facility] [employer] PRO[facility]  [address], [place] [phone]  Patients are seen by appointment only. Walk-ins are not accepted. [facility] will see patients 47 years of age and older. One Wednesday Evening (Monthly: Volunteer Based).  $30 per visit, cash only  [employer] [facility]  [phone] for adults; Children under age 744, call Graduate Pediatric Dentistry at [phone]. Children aged 664-14, please call [phone] to request a pediatric application.  Dental services are provided in all areas of dental care including fillings, crowns and bridges, complete and partial dentures, implants, gum treatment, root canals, and extractions. Preventive care is also provided. Treatment is provided to both adults and children. Patients are selected via a lottery and there is often a waiting list.   [facility] [address], [place]  [phone] [email or web address]   [facility] [address], [place], [place] [phone], Ext. [phone] Second and Fourth Thursday of each month, opens at 6:30 AM; Clinic ends at 9 AM.  Patients are seen on a first-come first-served basis, and a limited number are seen during each clinic.   [facility]  [address], [place], [place] [phone]   Eligibility Requirements You must have lived in [place], [place], or [place] counties for at least the last three months.   You cannot be eligible for state or federal sponsored [place]healthcare insurance, including CIGNAVeterans Administration, [place]icaid, or [employer]Medicare.   You generally cannot be eligible for healthcare insurance through your employer.     How to apply: Eligibility screenings are held every Tuesday and Wednesday afternoon from 1:00 pm until 4:00 pm. You do not need an appointment for the interview!  [facility] [address], [place], [place] [phone]   [facility]  [phone]   [facility]  [phone]   [facility]  [phone]    Behavioral Health Resources in the Community: Intensive Outpatient Programs Organization         Address  Phone  Notes  [facility] [address], [place], [place] [phone]   [facility] [address], [place], [place] [phone]   [facility] [address], [place], [place]  [phone]   [facility] [address],  [place], [place] [phone] or [phone]   Substance Abuse Resources Organization         Address  Phone  Notes  [facility]  [phone]   [facility]  [phone]   [facility]  [phone]   [facility]  [phone]   Residential & Outpatient Substance Abuse Program  [phone]   Psychological Services Organization         Address  Phone  Notes  [facility]  [phone]- [phone]   [facility]  [phone]- [phone]   [facility] [address], [place] [phone] or [phone]    Mobile Crisis Teams Organization         Address  Phone  Notes  [facility], Mobile Crisis Care Unit  [phone]   [facility]  [address] [place], [place] [phone]   Doristine LocksSharon DeEsch [address] [place] [phone]    Self-Help/Support Groups Organization         Address  Phone             Notes  [facility] [place] - variety of support groups  [phone]- [phone] Call  for more information  Narcotics Anonymous (NA), [facility] [address]  Dr, [employer] [place]  2 meetings at this location   Residential Sports administrator         Address  Phone  Notes  [facility] [address],    [place]  [phone]   [facility]  [address], [place] [address], [place], [place] [phone]   [facility] [address] [place], [place] [phone] Admissions: 8am-3pm M-F  [facility] [address]    [place], [place] [phone]   [facility] [address] [place], [place], [place] [phone]   [facility] [address],  [place], [place] [phone]   [facility] - Intensive Outpatient [address] Laurell Josephs [address], [place], [place] [phone]   [facility] ([facility] [address]  [place], [place] [phone] or [phone]   [facility] ([facility]) [address] [place], [place] [phone] Accepts Medicaid  [facility] [address]  [address] [place] [phone] Substance Abuse/Addiction Treatment   [facility] Organization         Address  Phone  Notes  [facility]  [phone]   Angie Fava, PhD [address] Ervin Knack [place], [place]   [phone] or [phone]   [facility]   [address] [place], [place] [phone]   [facility] [address] [place], [place] [phone] Insurance/Medicaid/sponsorship through [facility] and Families [address]                                    [place], [place] [phone] Therapy/tele-psych/case  [facility] [address], [place] [phone]    Dr. Lolly Mustache  [phone]   [facility]  [employer] [facility] Dept. 1) [address] [place] 2) [address], [place] 3)  [address], [place] [phone]  [phone]   [facility] Child Abuse  Hotline [phone] or [phone] (After Hours)

## 2014-08-21 ENCOUNTER — Encounter (HOSPITAL_COMMUNITY): Payer: Self-pay | Admitting: Emergency Medicine

## 2014-08-21 ENCOUNTER — Emergency Department (HOSPITAL_COMMUNITY)
Admission: EM | Admit: 2014-08-21 | Discharge: 2014-08-21 | Disposition: A | Discharge status: Home / Self Care | Payer: Medicaid Other | Attending: Emergency Medicine | Admitting: Emergency Medicine

## 2014-08-21 DIAGNOSIS — Z791 Long term (current) use of non-steroidal anti-inflammatories (NSAID): Secondary | ICD-10-CM | POA: Diagnosis not present

## 2014-08-21 DIAGNOSIS — Z8719 Personal history of other diseases of the digestive system: Secondary | ICD-10-CM | POA: Insufficient documentation

## 2014-08-21 DIAGNOSIS — Z590 Homelessness: Secondary | ICD-10-CM | POA: Diagnosis not present

## 2014-08-21 DIAGNOSIS — I10 Essential (primary) hypertension: Secondary | ICD-10-CM | POA: Insufficient documentation

## 2014-08-21 DIAGNOSIS — M25562 Pain in left knee: Secondary | ICD-10-CM | POA: Diagnosis not present

## 2014-08-21 DIAGNOSIS — Z792 Long term (current) use of antibiotics: Secondary | ICD-10-CM | POA: Diagnosis not present

## 2014-08-21 DIAGNOSIS — M25561 Pain in right knee: Secondary | ICD-10-CM | POA: Diagnosis not present

## 2014-08-21 DIAGNOSIS — Z880 Allergy status to penicillin: Secondary | ICD-10-CM | POA: Diagnosis not present

## 2014-08-21 DIAGNOSIS — Z720 Tobacco use: Secondary | ICD-10-CM | POA: Diagnosis not present

## 2014-08-21 MED ORDER — HYDROCODONE-ACETAMINOPHEN 5-325 MG PO TABS: 1.0000 | ORAL_TABLET | Freq: Four times a day (QID) | ORAL | Status: DC | PRN

## 2014-08-21 MED ORDER — NAPROXEN 500 MG PO TABS: 500.0000 mg | ORAL_TABLET | Freq: Two times a day (BID) | ORAL | Status: DC

## 2014-08-21 NOTE — Discharge Instructions (Signed)
Call an orthopedic specialist for further evaluation of your knee pain. Call for a follow up appointment with a Family or Primary Care Provider.  Return if Symptoms worsen.   Take medication as prescribed.  Wear shoes with condition and support. Ice your knees 3-4 times a day.

## 2014-08-21 NOTE — ED Provider Notes (Signed)
CSN: [id number]     Arrival date & time 08/21/14  0034 History   First MD Initiated Contact with Patient 08/21/14 0047     Chief Complaint  Patient presents with  . Arthritis  . Knee Pain     (Consider location/radiation/quality/duration/timing/severity/associated sxs/prior Treatment) HPI Comments: The patient is a 48 year old female with past plantar history of hypertension, reflux, alcohol abuse presenting to the emergency department chief complaint of worsening left knee pain for over one week. Patient denies injury to knee. Patient reports onset of discomfort several weeks ago, similar to right knee discomfort. Patient has not followed up with a orthopedist for further evaluation of right knee arthritis. Patient denies fever, chills, swelling to knee. Pain worsened with movement and ambulation.  Patient is a 48 y.o. female presenting with arthritis and knee pain. The history is provided by the patient. No language interpreter was used.  Arthritis Associated symptoms include arthralgias. Pertinent negatives include no chills, fever or joint swelling.  Knee Pain Associated symptoms: no fever     Past Medical History  Diagnosis Date  . Hypertension   . GERD (gastroesophageal reflux disease)   . Alcohol abuse   . Homelessness    Past Surgical History  Procedure Laterality Date  . Cesarean section  1997   History reviewed. No pertinent family history. History  Substance Use Topics  . Smoking status: Current Every Day Smoker -- 1.00 packs/day for 29 years    Types: Cigarettes  . Smokeless tobacco: Never Used  . Alcohol Use: No     Comment: "quit drinking in ~ 2013"   OB History    No data available     Review of Systems  Constitutional: Negative for fever and chills.  Musculoskeletal: Positive for arthralgias and arthritis. Negative for joint swelling.  Skin: Negative for color change and wound.      Allergies  Ibuprofen and Penicillins  Home Medications   Prior  to Admission medications   Medication Sig Start Date End Date Taking? Authorizing Provider  bacitracin ointment Apply topically 2 (two) times daily. 05/25/14   Ricki RodriguezAjay S Kadakia, MD  HYDROcodone-acetaminophen (NORCO/VICODIN) 5-325 MG per tablet Take 1 tablet by mouth every 4 (four) hours as needed for moderate pain. 05/25/14   Ricki RodriguezAjay S Kadakia, MD  meloxicam (MOBIC) 15 MG tablet Take 1 tablet (15 mg total) by mouth daily. 06/12/14   Antony MaduraKelly Humes, PA-C   BP 160/98 mmHg  Pulse 100  Temp(Src) 98.8 F (37.1 C) (Oral)  Resp 18  Ht 5\' 3"  (1.6 m)  Wt 165 lb (74.844 kg)  BMI 29.24 kg/m2  SpO2 100%  LMP 10/15/2013 Physical Exam  Constitutional: She is oriented to person, place, and time. She appears well-developed and well-nourished. No distress.  HENT:  Head: Normocephalic and atraumatic.  Neck: Neck supple.  Pulmonary/Chest: Effort normal. No respiratory distress.  Musculoskeletal:       Right knee: She exhibits normal range of motion and no swelling. Tenderness found. Medial joint line and lateral joint line tenderness noted.       Left knee: She exhibits normal range of motion and no swelling. Tenderness found. Medial joint line and lateral joint line tenderness noted.  Mild tenderness to bilateral knees, no obvious deformity, no obvious joint swelling.  Neurological: She is alert and oriented to person, place, and time.  Skin: Skin is warm and dry. She is not diaphoretic.  Psychiatric: She has a normal mood and affect. Her behavior is normal.  Nursing note and  vitals reviewed.   ED Course  Procedures (including critical care time) Labs Review Labs Reviewed - No data to display  Imaging Review No results found.   EKG Interpretation None      MDM   Final diagnoses:  Left knee pain   Patient with one week of knee pain, nontraumatic, no sign of infection, no history of procedures, likely arthritis.  Previous x-rays show arthritis and right knee. Plan to treat with anti-inflammatories,  knee sleeve, narcotic pain medication, follow up with orthopedist.  Meds given in ED:  Medications - No data to display  Discharge Medication List as of 08/21/2014  1:26 AM    START taking these medications   Details  !! HYDROcodone-acetaminophen (NORCO/VICODIN) 5-325 MG per tablet Take 1 tablet by mouth every 6 (six) hours as needed for moderate pain or severe pain., Starting 08/21/2014, Until Discontinued, Print    naproxen (NAPROSYN) 500 MG tablet Take 1 tablet (500 mg total) by mouth 2 (two) times daily with a meal., Starting 08/21/2014, Until Discontinued, Print     !! - Potential duplicate medications found. Please discuss with provider.        Mellody DrownLauren Davonte Siebenaler, PA-C 08/22/14 [phone]  Mirian MoMatthew Gentry, MD 08/22/14 [phone]

## 2014-10-22 ENCOUNTER — Encounter (HOSPITAL_COMMUNITY): Payer: Self-pay

## 2014-10-22 ENCOUNTER — Emergency Department (HOSPITAL_COMMUNITY)
Admission: EM | Admit: 2014-10-22 | Discharge: 2014-10-23 | Discharge status: Against Medical Advice | Payer: Medicaid Other | Attending: Emergency Medicine | Admitting: Emergency Medicine

## 2014-10-22 DIAGNOSIS — M79671 Pain in right foot: Secondary | ICD-10-CM | POA: Insufficient documentation

## 2014-10-22 DIAGNOSIS — Z720 Tobacco use: Secondary | ICD-10-CM | POA: Insufficient documentation

## 2014-10-22 DIAGNOSIS — I10 Essential (primary) hypertension: Secondary | ICD-10-CM | POA: Diagnosis not present

## 2014-10-22 HISTORY — DX: Gout, unspecified: M10.9

## 2014-10-22 NOTE — ED Notes (Addendum)
Per Registration, Pt has returned to lobby.  She had walked outside.

## 2014-10-22 NOTE — ED Notes (Signed)
Per EMS, Pt, from home, c/o R foot pain x 3 days.  Pain score 8/10, with ambulation.  Swelling and warmth noted.  Hx of gout.  Pt has been noncompliant w/ medications.  Pt has ETOH on board.

## 2014-10-22 NOTE — ED Notes (Signed)
Twice pt has been called, pt is not present in lobby. Pt was put out in lobby in wheel chair, visitors in lobby stated they saw her walk out.

## 2014-10-22 NOTE — ED Notes (Signed)
Pt not present at bedside.

## 2014-10-22 NOTE — ED Notes (Addendum)
Pt ambulating in hall way and speaking to other patients. Gait steady.

## 2014-10-22 NOTE — ED Notes (Signed)
Pt not at bedside.

## 2014-10-26 ENCOUNTER — Emergency Department (HOSPITAL_COMMUNITY)
Admission: EM | Admit: 2014-10-26 | Discharge: 2014-10-26 | Disposition: A | Discharge status: Home / Self Care | Payer: Medicaid Other | Attending: Emergency Medicine | Admitting: Emergency Medicine

## 2014-10-26 DIAGNOSIS — M79661 Pain in right lower leg: Secondary | ICD-10-CM | POA: Diagnosis not present

## 2014-10-26 DIAGNOSIS — Z79899 Other long term (current) drug therapy: Secondary | ICD-10-CM | POA: Insufficient documentation

## 2014-10-26 DIAGNOSIS — M25461 Effusion, right knee: Secondary | ICD-10-CM | POA: Insufficient documentation

## 2014-10-26 DIAGNOSIS — M7989 Other specified soft tissue disorders: Secondary | ICD-10-CM | POA: Diagnosis not present

## 2014-10-26 DIAGNOSIS — Z590 Homelessness: Secondary | ICD-10-CM | POA: Insufficient documentation

## 2014-10-26 DIAGNOSIS — M79604 Pain in right leg: Secondary | ICD-10-CM

## 2014-10-26 DIAGNOSIS — Z8719 Personal history of other diseases of the digestive system: Secondary | ICD-10-CM | POA: Diagnosis not present

## 2014-10-26 DIAGNOSIS — Z791 Long term (current) use of non-steroidal anti-inflammatories (NSAID): Secondary | ICD-10-CM | POA: Diagnosis not present

## 2014-10-26 DIAGNOSIS — Z880 Allergy status to penicillin: Secondary | ICD-10-CM | POA: Insufficient documentation

## 2014-10-26 DIAGNOSIS — M109 Gout, unspecified: Secondary | ICD-10-CM | POA: Diagnosis not present

## 2014-10-26 DIAGNOSIS — Z720 Tobacco use: Secondary | ICD-10-CM | POA: Insufficient documentation

## 2014-10-26 DIAGNOSIS — I10 Essential (primary) hypertension: Secondary | ICD-10-CM | POA: Diagnosis not present

## 2014-10-26 MED ORDER — OXYCODONE-ACETAMINOPHEN 5-325 MG PO TABS: 1.0000 | ORAL_TABLET | ORAL | Status: DC | PRN

## 2014-10-26 MED ORDER — OXYCODONE-ACETAMINOPHEN 5-325 MG PO TABS
1.0000 | ORAL_TABLET | Freq: Once | ORAL | Status: AC
  Administered 2014-10-26: 1 via ORAL
  Filled 2014-10-26: qty 1

## 2014-10-26 NOTE — ED Notes (Signed)
Pt c/o gout in right lower leg since Saturday.

## 2014-10-26 NOTE — ED Provider Notes (Signed)
CSN: [id number]     Arrival date & time 10/26/14  1341 History   This chart was scribed for non-physician practitioner, Louann Sjogren, PA-C, working with Rolland Porter, MD, by Lionel December, ED Scribe. This patient was seen in room WTR6/WTR6 and the patient's care was started at 2:37 PM.   First MD Initiated Contact with Patient 10/26/14 1415     Chief Complaint  Patient presents with  . Gout     (Consider location/radiation/quality/duration/timing/severity/associated sxs/prior Treatment) HPI  HPI Comments: Amy Conway is a 49 y.o. female who presents to the Emergency Department complaining constant right lower leg pain onset 4 days ago.  Patient has associated symptoms of swelling and states that it is difficult to ambulate. Pain is worse with standing and feels better with elevation. Patient has associated symptoms of  Tingling. No numbness or weakness. No fevers chills. Patient has not had any recent injuries/falls or surgeries. Patient has never had any blot clots and does not take exogenous estrogen.  Patient was diagnosed with gout in November 2015.  Patient denies fevers, chills, shortness of breath. Patient has run out of her gout medication.  She has never had definitive diagnosis of gout or arthrocentesis. She is a smoker and has never been diagnosed with high blood pressure.  She was given a PCP referral today.  Patient has no history of cancer.      Past Medical History  Diagnosis Date  . Hypertension   . GERD (gastroesophageal reflux disease)   . Alcohol abuse   . Homelessness   . Gout    Past Surgical History  Procedure Laterality Date  . Cesarean section  1997   No family history on file. History  Substance Use Topics  . Smoking status: Current Every Day Smoker -- 1.00 packs/day for 29 years    Types: Cigarettes  . Smokeless tobacco: Never Used  . Alcohol Use: Yes   OB History    No data available     Review of Systems  Constitutional: Negative for  fever and chills.  Respiratory: Negative for shortness of breath.   Cardiovascular: Negative for chest pain.  Gastrointestinal: Negative for nausea and vomiting.  Musculoskeletal: Positive for myalgias, joint swelling and arthralgias.  Neurological: Negative for weakness and numbness.      Allergies  Ibuprofen and Penicillins  Home Medications   Prior to Admission medications   Medication Sig Start Date End Date Taking? Authorizing Provider  acetaminophen (TYLENOL) 500 MG tablet Take 1,000 mg by mouth every 6 (six) hours as needed for mild pain.   Yes Historical Provider, MD  naproxen sodium (ANAPROX) 220 MG tablet Take 440 mg by mouth 2 (two) times daily with a meal.   Yes Historical Provider, MD  bacitracin ointment Apply topically 2 (two) times daily. Patient not taking: Reported on 10/26/2014 05/25/14   Ricki Rodriguez, MD  HYDROcodone-acetaminophen (NORCO/VICODIN) 5-325 MG per tablet Take 1 tablet by mouth every 4 (four) hours as needed for moderate pain. Patient not taking: Reported on 10/26/2014 05/25/14   Ricki Rodriguez, MD  HYDROcodone-acetaminophen (NORCO/VICODIN) 5-325 MG per tablet Take 1 tablet by mouth every 6 (six) hours as needed for moderate pain or severe pain. Patient not taking: Reported on 10/26/2014 08/21/14   Mellody Drown, PA-C  meloxicam (MOBIC) 15 MG tablet Take 1 tablet (15 mg total) by mouth daily. Patient not taking: Reported on 10/26/2014 06/12/14   Antony Madura, PA-C  naproxen (NAPROSYN) 500 MG tablet Take 1 tablet (500  mg total) by mouth 2 (two) times daily with a meal. Patient not taking: Reported on 10/26/2014 08/21/14   Mellody DrownLauren Parker, PA-C  oxyCODONE-acetaminophen (PERCOCET/ROXICET) 5-325 MG per tablet Take 1-2 tablets by mouth every 4 (four) hours as needed for moderate pain or severe pain. 10/26/14   Benetta SparVictoria L Feras Gardella, PA-C   BP 163/93 mmHg  Pulse 86  Temp(Src) 98.9 F (37.2 C) (Oral)  Resp 18  SpO2 100%  LMP 10/15/2013 Physical Exam  Constitutional: She  appears well-developed and well-nourished. No distress.  HENT:  Head: Normocephalic and atraumatic.  Eyes: Conjunctivae and EOM are normal. Right eye exhibits no discharge. Left eye exhibits no discharge.  Cardiovascular: Normal rate and regular rhythm.   Pulses:      Dorsalis pedis pulses are 2+ on the right side, and 2+ on the left side.       Posterior tibial pulses are 2+ on the right side, and 2+ on the left side.  Pulmonary/Chest: Effort normal and breath sounds normal. No respiratory distress. She has no wheezes.  Abdominal: Soft. Bowel sounds are normal. She exhibits no distension. There is no tenderness.  Musculoskeletal:       Right knee: She exhibits swelling. She exhibits no effusion. Tenderness found.  Tenderness over right knee lateral joint line.  No right knee effusion, no erythema or warmth, full ROM. No crepitus noted.   anterior right sided tib discomfort.  Negative anterior and posterior drawer test bilaterally.   No ligamentous laxity of bilateral knees. Patient with minimal to no edema bilaterally to ankles bilaterally.  Swelling appears to be equal on both sides, right calf tenderness no palpable cord or induration.  Full range of motion of bilateral ankles without swelling or erythema.   Neurological: She is alert. She exhibits normal muscle tone. Coordination normal.  5 out of 5 strength in bilateral lower extremities. Sensation intact.  Skin: Skin is warm and dry. She is not diaphoretic.  Psychiatric: She has a normal mood and affect. Her behavior is normal.  Nursing note and vitals reviewed.   ED Course  Procedures (including critical care time) DIAGNOSTIC STUDIES: Oxygen Saturation is 100% on RA, normal by my interpretation.    COORDINATION OF CARE: 2:46 PM Discussed treatment plan with patient at beside, the patient agrees with the plan and has no further questions at this time.   Labs Review Labs Reviewed - No data to display  Imaging Review No  results found.   EKG Interpretation None      MDM   Final diagnoses:  Lower extremity pain, right   Patient with history of gout presenting with right calf tenderness with ant tenderness for 4 days as well as minimal bilateral swelling that she has intermittently for  months. Patient low risk for DVT or PE. We'll get a duplex due to her significant pain. No erythema. Neurovascularly intact. Patient able to ambulate unassisted. Duplex without any evidence of right lower DVT. Patient given Percocet due to her discomfort. Driving and sedation precautions provided. Patient encouraged to follow-up with and  establish care with a primary care provider. She has been encouraged to this many times but has not. Stressed the importance.  Discussed return precautions with patient. Discussed all results and patient verbalizes understanding and agrees with plan.  I personally performed the services described in this documentation, which was scribed in my presence. The recorded information has been reviewed and is accurate.   Louann SjogrenVictoria L Juston Goheen, PA-C 10/26/14 1709  Rolland PorterMark James, MD  10/29/14 2109 

## 2014-10-26 NOTE — Discharge Instructions (Signed)
Return to the emergency room with worsening of symptoms, new symptoms or with symptoms that are concerning, especially fevers, numbness, tingling, swelling, redness or red streaks from the area, severe pain, unable to walk on leg. RICE: Rest, Ice (three cycles of 20 mins on, 20mins off at least twice a day), compression/brace, elevation. Heating pad works well for back pain. Ibuprofen 400mg  (2 tablets 200mg ) every 5-6 hours for 3-5 days. Percocet for severe pain. Do not operate machinery, drive or drink alcohol while taking narcotics or muscle relaxers. Follow up with PCP/orthopedist if symptoms worsen or are persistent. Read below information and follow recommendations.

## 2014-10-26 NOTE — ED Notes (Signed)
Ultrasound completed

## 2014-10-26 NOTE — Progress Notes (Signed)
VASCULAR LAB PRELIMINARY  PRELIMINARY  PRELIMINARY  PRELIMINARY  Right lower extremity venous duplex completed.    Preliminary report:  Right:  No evidence of DVT, superficial thrombosis, or Baker's cyst.  Omauri Boeve, RVT 10/26/2014, 3:51 PM

## 2014-10-26 NOTE — ED Notes (Signed)
Ultrasound in process at bedside at this time. 

## 2014-12-03 ENCOUNTER — Emergency Department (HOSPITAL_COMMUNITY)
Admission: EM | Admit: 2014-12-03 | Discharge: 2014-12-03 | Disposition: A | Discharge status: Home / Self Care | Payer: Medicaid Other | Attending: Emergency Medicine | Admitting: Emergency Medicine

## 2014-12-03 ENCOUNTER — Encounter (HOSPITAL_COMMUNITY): Payer: Self-pay | Admitting: *Deleted

## 2014-12-03 DIAGNOSIS — Z8719 Personal history of other diseases of the digestive system: Secondary | ICD-10-CM | POA: Insufficient documentation

## 2014-12-03 DIAGNOSIS — Z792 Long term (current) use of antibiotics: Secondary | ICD-10-CM | POA: Insufficient documentation

## 2014-12-03 DIAGNOSIS — M109 Gout, unspecified: Secondary | ICD-10-CM | POA: Insufficient documentation

## 2014-12-03 DIAGNOSIS — Z791 Long term (current) use of non-steroidal anti-inflammatories (NSAID): Secondary | ICD-10-CM | POA: Diagnosis not present

## 2014-12-03 DIAGNOSIS — M545 Low back pain: Secondary | ICD-10-CM | POA: Insufficient documentation

## 2014-12-03 DIAGNOSIS — Z590 Homelessness: Secondary | ICD-10-CM | POA: Insufficient documentation

## 2014-12-03 DIAGNOSIS — I10 Essential (primary) hypertension: Secondary | ICD-10-CM | POA: Diagnosis not present

## 2014-12-03 DIAGNOSIS — Z720 Tobacco use: Secondary | ICD-10-CM | POA: Diagnosis not present

## 2014-12-03 DIAGNOSIS — M25561 Pain in right knee: Secondary | ICD-10-CM | POA: Insufficient documentation

## 2014-12-03 DIAGNOSIS — Z880 Allergy status to penicillin: Secondary | ICD-10-CM | POA: Insufficient documentation

## 2014-12-03 MED ORDER — TRAMADOL HCL 50 MG PO TABS: 50.0000 mg | ORAL_TABLET | Freq: Four times a day (QID) | ORAL | Status: DC | PRN

## 2014-12-03 NOTE — ED Provider Notes (Signed)
CSN: [id number]     Arrival date & time 12/03/14  0226 History   First MD Initiated Contact with Patient 12/03/14 [phone]     Chief Complaint  Patient presents with  . Foot Pain     (Consider location/radiation/quality/duration/timing/severity/associated sxs/prior Treatment) HPI Comments: Patient presents today complaining of pain of the right knee.  Nursing Triage note states foot pain, however, patient denies any pain in her foot.  She states that the pain is in her knee.  She reports that the pain has been constant for the past 3 days, which was gradually worsening.  She reports that she has had similar pain intermittently over the past year.   Denies injury or trauma.  She also reports associated lower back pain.  No bowel or bladder incontinence.  She denies fever or chills.  No numbness or tingling.  Denies urinary symptoms.  She reports that both the pain in her knee and the pain in her back is worse with movement.  She has been taking Naproxen and Zanaflex for her pain without improvement.  Patient denies history of Cancer or IVDU.  The history is provided by the patient.    Past Medical History  Diagnosis Date  . Hypertension   . GERD (gastroesophageal reflux disease)   . Alcohol abuse   . Homelessness   . Gout    Past Surgical History  Procedure Laterality Date  . Cesarean section  1997   No family history on file. History  Substance Use Topics  . Smoking status: Current Every Day Smoker -- 1.00 packs/day for 29 years    Types: Cigarettes  . Smokeless tobacco: Never Used  . Alcohol Use: Yes   OB History    No data available     Review of Systems  All other systems reviewed and are negative.     Allergies  Ibuprofen and Penicillins  Home Medications   Prior to Admission medications   Medication Sig Start Date End Date Taking? Authorizing Provider  acetaminophen (TYLENOL) 500 MG tablet Take 1,000 mg by mouth every 6 (six) hours as needed for mild pain.     Historical Provider, MD  bacitracin ointment Apply topically 2 (two) times daily. Patient not taking: Reported on 10/26/2014 05/25/14   Orpah Cobb, MD  HYDROcodone-acetaminophen (NORCO/VICODIN) 5-325 MG per tablet Take 1 tablet by mouth every 4 (four) hours as needed for moderate pain. Patient not taking: Reported on 10/26/2014 05/25/14   Orpah Cobb, MD  HYDROcodone-acetaminophen (NORCO/VICODIN) 5-325 MG per tablet Take 1 tablet by mouth every 6 (six) hours as needed for moderate pain or severe pain. Patient not taking: Reported on 10/26/2014 08/21/14   Mellody Drown, PA-C  meloxicam (MOBIC) 15 MG tablet Take 1 tablet (15 mg total) by mouth daily. Patient not taking: Reported on 10/26/2014 06/12/14   Antony Madura, PA-C  naproxen (NAPROSYN) 500 MG tablet Take 1 tablet (500 mg total) by mouth 2 (two) times daily with a meal. Patient not taking: Reported on 10/26/2014 08/21/14   Mellody Drown, PA-C  naproxen sodium (ANAPROX) 220 MG tablet Take 440 mg by mouth 2 (two) times daily with a meal.    Historical Provider, MD  oxyCODONE-acetaminophen (PERCOCET/ROXICET) 5-325 MG per tablet Take 1-2 tablets by mouth every 4 (four) hours as needed for moderate pain or severe pain. 10/26/14   Oswaldo Conroy, PA-C   BP 117/88 mmHg  Pulse 82  Temp(Src) 97.7 F (36.5 C) (Oral)  Resp 17  Ht  (1.549 m)  Wt 170 lb (77.111 kg)  BMI 32.14 kg/m2  SpO2 100%  LMP 10/15/2013 Physical Exam  Constitutional: She appears well-developed and well-nourished.  HENT:  Head: Normocephalic and atraumatic.  Neck: Normal range of motion. Neck supple.  Cardiovascular: Normal rate, regular rhythm and normal heart sounds.   Pulses:      Dorsalis pedis pulses are 2+ on the right side, and 2+ on the left side.  Pulmonary/Chest: Effort normal and breath sounds normal.  Musculoskeletal: Normal range of motion.       Right knee: She exhibits normal range of motion, no swelling, no effusion, no deformity and no erythema. Tenderness  found.       Lumbar back: She exhibits tenderness and bony tenderness. She exhibits normal range of motion, no swelling, no edema and no deformity.  Diffuse tenderness to palpation of the right knee.  No erythema, edema, or warmth of the knee.  Neurological: She is alert. She has normal strength. No sensory deficit. Gait normal.  Reflex Scores:      Patellar reflexes are 2+ on the right side and 2+ on the left side. Distal sensation of both feet intact Muscle strength 5/5 of lower extremities bilaterally  Skin: Skin is warm and dry.  Psychiatric: She has a normal mood and affect.  Nursing note and vitals reviewed.   ED Course  Procedures (including critical care time) Labs Review Labs Reviewed - No data to display  Imaging Review No results found.   EKG Interpretation None      MDM   Final diagnoses:  None   Patient presents with pain of her right knee and also lower back.  No acute injury or trauma.  No erythema, edema, or warmth of the knee.  Full ROM of the knee.  Therefore, doubt Gout or Septic joint.  Patient with back pain.  No neurological deficits and normal neuro exam.  Patient can walk but states is painful.  No loss of bowel or bladder control.  No concern for cauda equina.  No fever, night sweats, weight loss, h/o cancer, IVDU.  Patient stable for discharge.  Return precautions given.     Santiago GladHeather Witney Huie, PA-C 12/03/14 [phone]  Marisa Severinlga Otter, MD 12/04/14 [phone]

## 2014-12-03 NOTE — ED Notes (Signed)
The pt is c/o foot pain fir 5-6 days hx of gout.  Her pain meds are not helping. She arrived by ptar   amb froim the amb

## 2014-12-09 IMAGING — CR DG CHEST 2V
2 series · 2 of 2 positions shown · non-contrast
Comparison: 06/23/2013

CLINICAL DATA: Cough.  Shortness of breath.

EXAM:
CHEST  2 VIEW

[w chest pa]
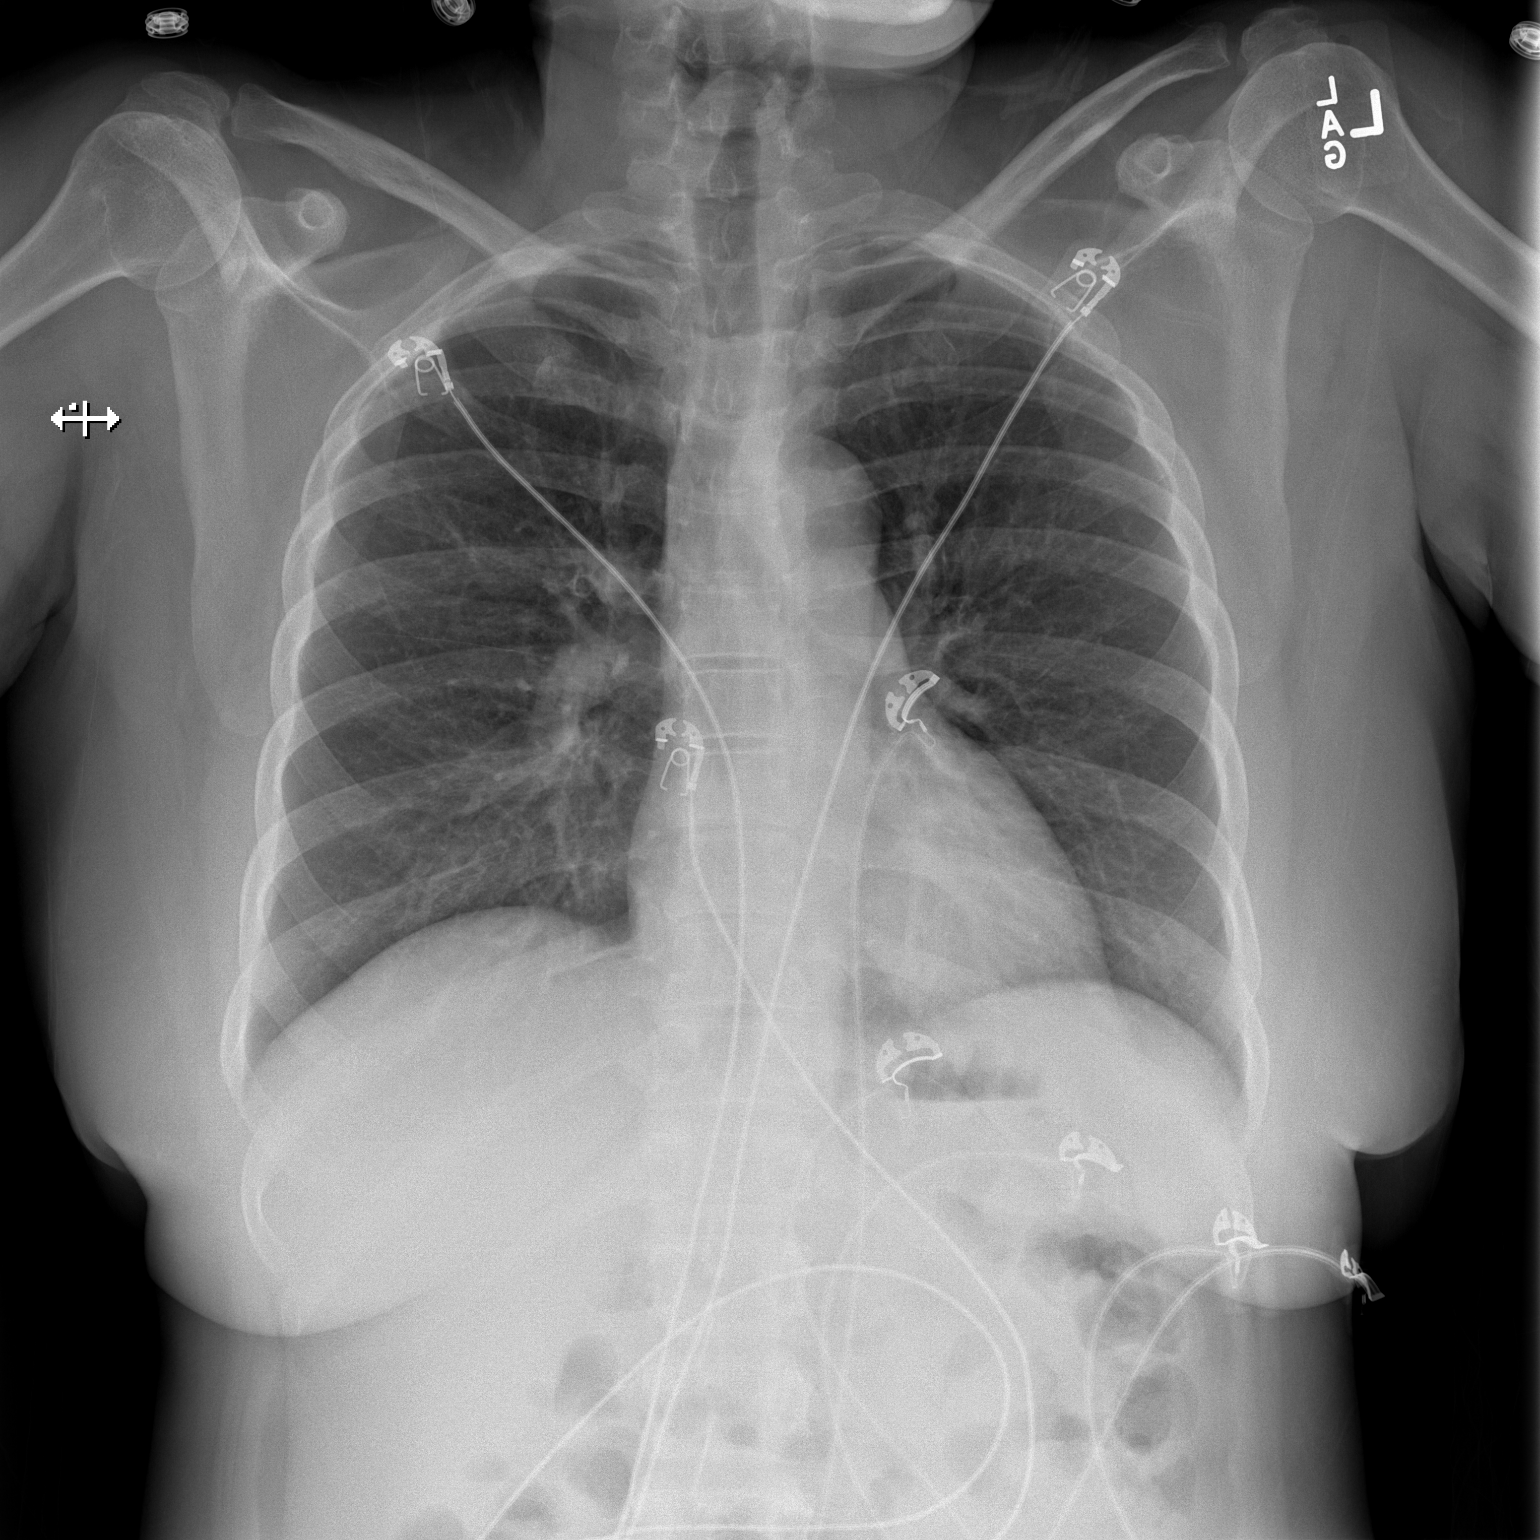

[w chest lat]
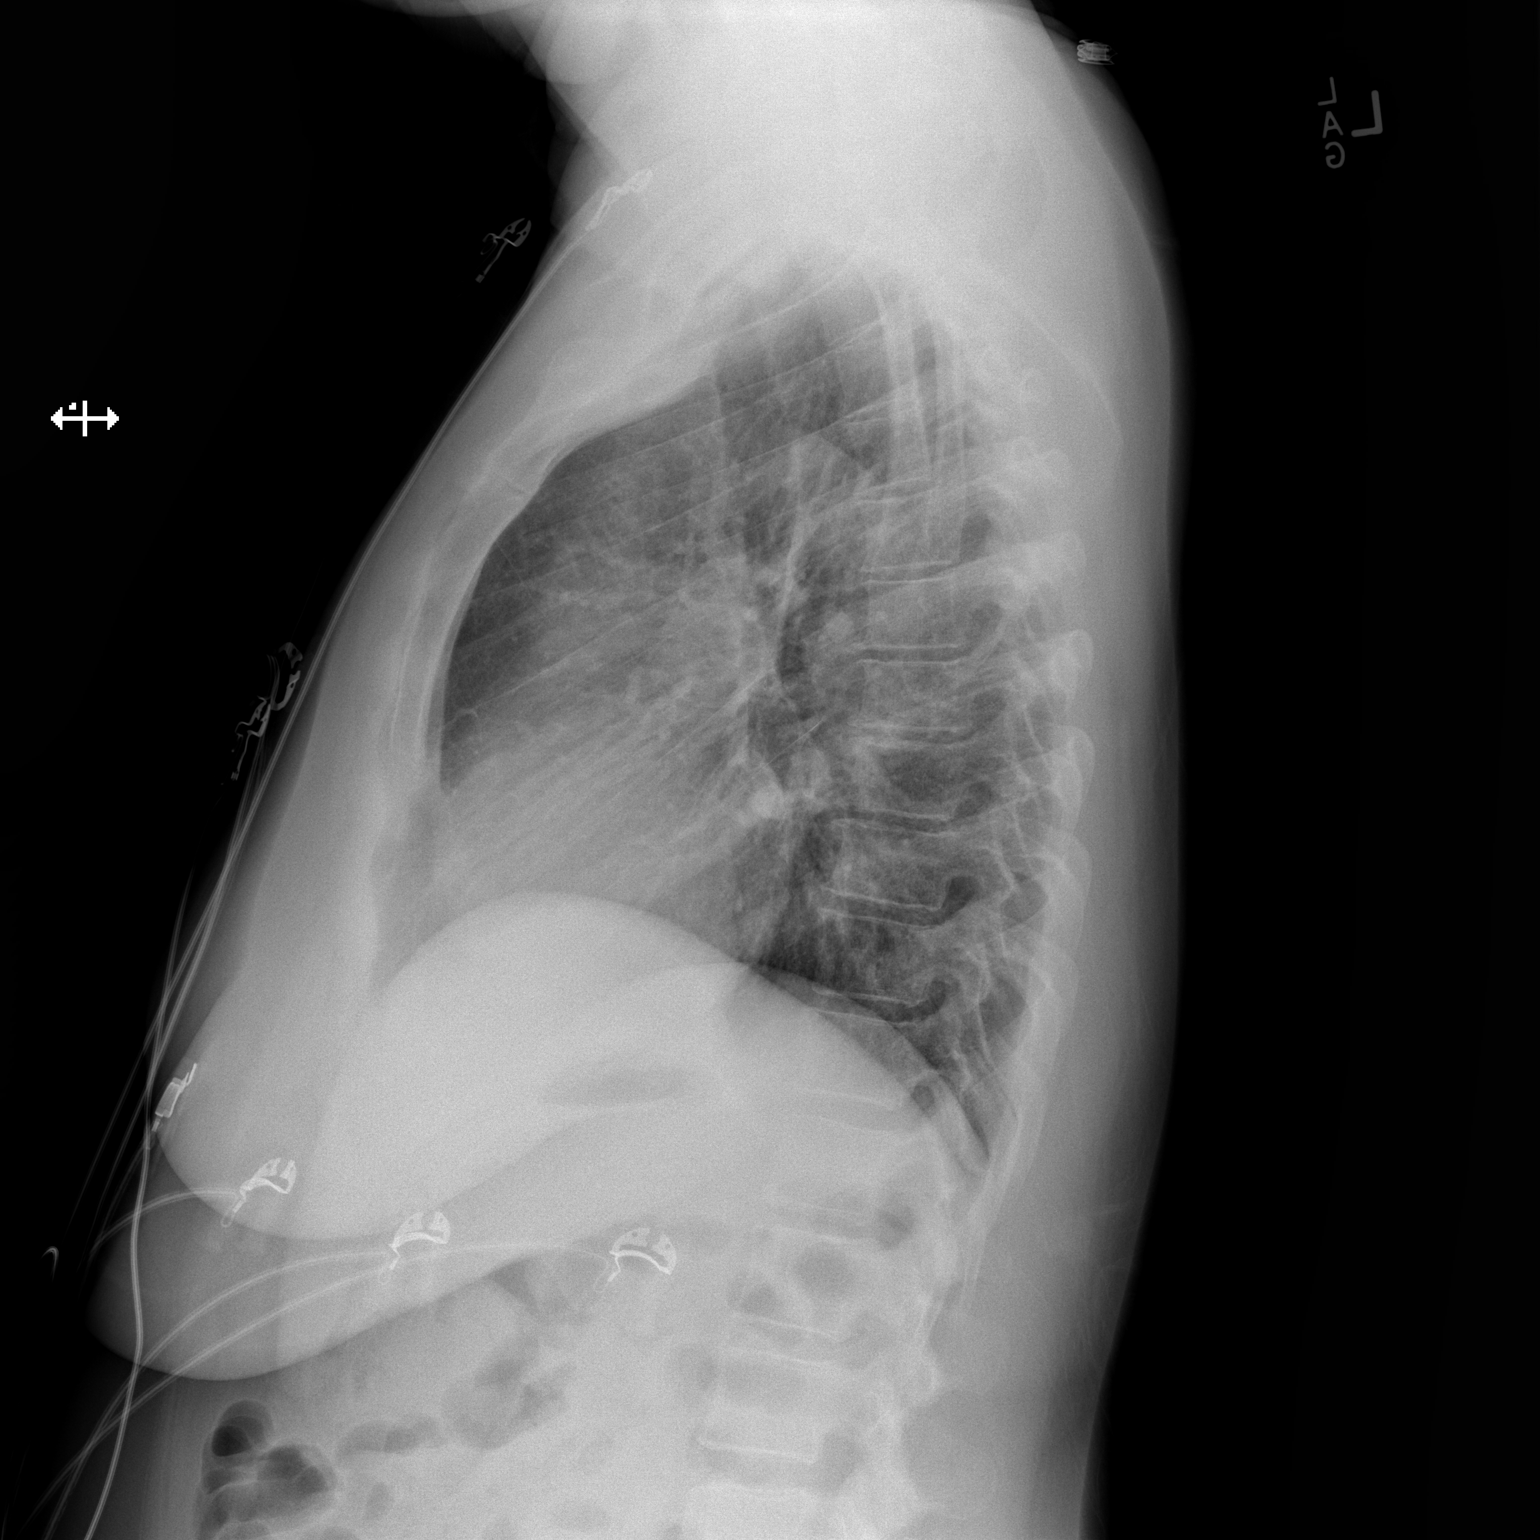

[2 of 2 positions shown; findings below may reference images not displayed]

FINDINGS: The heart size and mediastinal contours are within normal limits.
Both lungs are clear. The visualized skeletal structures are
unremarkable.
IMPRESSION: No active cardiopulmonary disease.

## 2015-02-16 ENCOUNTER — Emergency Department (HOSPITAL_COMMUNITY)
Admission: EM | Admit: 2015-02-16 | Discharge: 2015-02-17 | Disposition: A | Discharge status: Home / Self Care | Payer: Medicaid Other | Attending: Emergency Medicine | Admitting: Emergency Medicine

## 2015-02-16 ENCOUNTER — Encounter (HOSPITAL_COMMUNITY): Payer: Self-pay | Admitting: *Deleted

## 2015-02-16 DIAGNOSIS — R1084 Generalized abdominal pain: Secondary | ICD-10-CM | POA: Insufficient documentation

## 2015-02-16 DIAGNOSIS — Z720 Tobacco use: Secondary | ICD-10-CM | POA: Diagnosis not present

## 2015-02-16 DIAGNOSIS — Z79899 Other long term (current) drug therapy: Secondary | ICD-10-CM | POA: Diagnosis not present

## 2015-02-16 DIAGNOSIS — M1711 Unilateral primary osteoarthritis, right knee: Secondary | ICD-10-CM

## 2015-02-16 DIAGNOSIS — Z590 Homelessness: Secondary | ICD-10-CM | POA: Diagnosis not present

## 2015-02-16 DIAGNOSIS — R1110 Vomiting, unspecified: Secondary | ICD-10-CM

## 2015-02-16 DIAGNOSIS — Z8719 Personal history of other diseases of the digestive system: Secondary | ICD-10-CM | POA: Diagnosis not present

## 2015-02-16 DIAGNOSIS — M179 Osteoarthritis of knee, unspecified: Secondary | ICD-10-CM | POA: Diagnosis not present

## 2015-02-16 DIAGNOSIS — M2540 Effusion, unspecified joint: Secondary | ICD-10-CM | POA: Diagnosis present

## 2015-02-16 DIAGNOSIS — I10 Essential (primary) hypertension: Secondary | ICD-10-CM | POA: Diagnosis not present

## 2015-02-16 DIAGNOSIS — R197 Diarrhea, unspecified: Secondary | ICD-10-CM | POA: Insufficient documentation

## 2015-02-16 DIAGNOSIS — Z3202 Encounter for pregnancy test, result negative: Secondary | ICD-10-CM | POA: Diagnosis not present

## 2015-02-16 DIAGNOSIS — Z880 Allergy status to penicillin: Secondary | ICD-10-CM | POA: Diagnosis not present

## 2015-02-16 DIAGNOSIS — R1111 Vomiting without nausea: Secondary | ICD-10-CM | POA: Diagnosis not present

## 2015-02-16 LAB — URINALYSIS, ROUTINE W REFLEX MICROSCOPIC
BILIRUBIN URINE: NEGATIVE
Glucose, UA: NEGATIVE mg/dL
HGB URINE DIPSTICK: NEGATIVE
KETONES UR: NEGATIVE mg/dL
Leukocytes, UA: NEGATIVE
NITRITE: NEGATIVE
PROTEIN: NEGATIVE mg/dL
SPECIFIC GRAVITY, URINE: 1.004 — AB (ref 1.005–1.030)
Urobilinogen, UA: 0.2 mg/dL (ref 0.0–1.0)
pH: 5 (ref 5.0–8.0)

## 2015-02-16 LAB — POC URINE PREG, ED: Preg Test, Ur: NEGATIVE

## 2015-02-16 LAB — CBC WITH DIFFERENTIAL/PLATELET
Basophils Absolute: 0 10*3/uL (ref 0.0–0.1)
Basophils Relative: 1 % (ref 0–1)
Eosinophils Absolute: 0.1 10*3/uL (ref 0.0–0.7)
Eosinophils Relative: 1 % (ref 0–5)
HCT: 42.3 % (ref 36.0–46.0)
HEMOGLOBIN: 13.7 g/dL (ref 12.0–15.0)
Lymphocytes Relative: 51 % — ABNORMAL HIGH (ref 12–46)
Lymphs Abs: 2.7 10*3/uL (ref 0.7–4.0)
MCH: 23.9 pg — ABNORMAL LOW (ref 26.0–34.0)
MCHC: 32.4 g/dL (ref 30.0–36.0)
MCV: 73.8 fL — ABNORMAL LOW (ref 78.0–100.0)
Monocytes Absolute: 0.2 10*3/uL (ref 0.1–1.0)
Monocytes Relative: 4 % (ref 3–12)
NEUTROS PCT: 43 % (ref 43–77)
Neutro Abs: 2.3 10*3/uL (ref 1.7–7.7)
Platelets: 228 10*3/uL (ref 150–400)
RBC: 5.73 MIL/uL — ABNORMAL HIGH (ref 3.87–5.11)
RDW: 15.3 % (ref 11.5–15.5)
WBC: 5.3 10*3/uL (ref 4.0–10.5)

## 2015-02-16 LAB — COMPREHENSIVE METABOLIC PANEL
ALBUMIN: 4 g/dL (ref 3.5–5.0)
ALT: 29 U/L (ref 14–54)
ANION GAP: 12 (ref 5–15)
AST: 41 U/L (ref 15–41)
Alkaline Phosphatase: 102 U/L (ref 38–126)
BUN: 12 mg/dL (ref 6–20)
CO2: 24 mmol/L (ref 22–32)
CREATININE: 0.77 mg/dL (ref 0.44–1.00)
Calcium: 8.8 mg/dL — ABNORMAL LOW (ref 8.9–10.3)
Chloride: 102 mmol/L (ref 101–111)
GLUCOSE: 86 mg/dL (ref 65–99)
Potassium: 4 mmol/L (ref 3.5–5.1)
Sodium: 138 mmol/L (ref 135–145)
Total Bilirubin: 0.2 mg/dL — ABNORMAL LOW (ref 0.3–1.2)
Total Protein: 7.8 g/dL (ref 6.5–8.1)

## 2015-02-16 LAB — LIPASE, BLOOD: Lipase: 18 U/L — ABNORMAL LOW (ref 22–51)

## 2015-02-16 MED ORDER — SODIUM CHLORIDE 0.9 % IV BOLUS (SEPSIS)
1000.0000 mL | Freq: Once | INTRAVENOUS | Status: AC
  Administered 2015-02-16: 1000 mL via INTRAVENOUS

## 2015-02-16 MED ORDER — ONDANSETRON HCL 4 MG/2ML IJ SOLN
4.0000 mg | Freq: Once | INTRAMUSCULAR | Status: AC
  Administered 2015-02-16: 4 mg via INTRAVENOUS
  Filled 2015-02-16: qty 2

## 2015-02-16 MED ORDER — LOPERAMIDE HCL 2 MG PO CAPS
4.0000 mg | ORAL_CAPSULE | Freq: Once | ORAL | Status: AC
  Administered 2015-02-16: 4 mg via ORAL
  Filled 2015-02-16: qty 2

## 2015-02-16 NOTE — ED Notes (Signed)
Pt c/o abd pain since April, positive for diarrhea, denies vomiting. Also c/o rt knee swelling since October. Also c/o rt arm tingling x 1 week.

## 2015-02-17 MED ORDER — TRAMADOL HCL 50 MG PO TABS: 50.0000 mg | ORAL_TABLET | Freq: Four times a day (QID) | ORAL | Status: DC | PRN

## 2015-02-17 MED ORDER — LOPERAMIDE HCL 2 MG PO CAPS: 2.0000 mg | ORAL_CAPSULE | Freq: Once | ORAL | Status: DC

## 2015-02-17 NOTE — ED Provider Notes (Signed)
CSN: [id number]     Arrival date & time 02/16/15  2110 History   First MD Initiated Contact with Patient 02/16/15 2237     Chief Complaint  Patient presents with  . Abdominal Pain  . Joint Swelling     (Consider location/radiation/quality/duration/timing/severity/associated sxs/prior Treatment) HPI Comments: This 49 year old female who presents today with 3 days of intermittent diarrhea.  Denies vomiting.  She also has right knee pain and swelling.  She has known osteoarthritis although the nurse's notes that she's had arm tingling for weeks.  She denies this at my examination  Patient is a 49 y.o. female presenting with abdominal pain. The history is provided by the patient.  Abdominal Pain Pain location:  Generalized Pain quality: cramping   Pain severity:  Mild Onset quality:  Gradual Duration:  3 days Timing:  Intermittent Progression:  Unchanged Chronicity:  New Context: eating   Context: not diet changes, not laxative use and not retching   Relieved by:  Nothing Worsened by:  Eating Associated symptoms: diarrhea   Associated symptoms: no dysuria, no fever, no flatus, no nausea, no shortness of breath and no vomiting   Risk factors: alcohol abuse     Past Medical History  Diagnosis Date  . Hypertension   . GERD (gastroesophageal reflux disease)   . Alcohol abuse   . Homelessness   . Gout    Past Surgical History  Procedure Laterality Date  . Cesarean section  1997   No family history on file. History  Substance Use Topics  . Smoking status: Current Every Day Smoker -- 1.00 packs/day for 29 years    Types: Cigarettes  . Smokeless tobacco: Never Used  . Alcohol Use: Yes   OB History    No data available     Review of Systems  Constitutional: Negative for fever.  Respiratory: Negative for shortness of breath.   Cardiovascular: Negative for leg swelling.  Gastrointestinal: Positive for abdominal pain and diarrhea. Negative for nausea, vomiting and flatus.   Genitourinary: Negative for dysuria.  Musculoskeletal: Positive for joint swelling.  Neurological: Negative for weakness and numbness.  All other systems reviewed and are negative.     Allergies  Ibuprofen and Penicillins  Home Medications   Prior to Admission medications   Medication Sig Start Date End Date Taking? Authorizing Provider  acetaminophen (TYLENOL) 500 MG tablet Take 1,000 mg by mouth every 6 (six) hours as needed for mild pain.   Yes Historical Provider, MD  sertraline (ZOLOFT) 25 MG tablet Take 25 mg by mouth daily.   Yes Historical Provider, MD  loperamide (IMODIUM) 2 MG capsule Take 1 capsule (2 mg total) by mouth once. 02/17/15   Earley Favor, NP  oxyCODONE-acetaminophen (PERCOCET/ROXICET) 5-325 MG per tablet Take 1-2 tablets by mouth every 4 (four) hours as needed for moderate pain or severe pain. Patient not taking: Reported on 02/16/2015 10/26/14   Oswaldo Conroy, PA-C  traMADol (ULTRAM) 50 MG tablet Take 1 tablet (50 mg total) by mouth every 6 (six) hours as needed. 02/17/15   Earley Favor, NP   BP 136/79 mmHg  Pulse 76  Temp(Src) 97.8 F (36.6 C) (Oral)  Resp 20  Ht  (1.549 m)  Wt 169 lb (76.658 kg)  BMI 31.95 kg/m2  SpO2 94%  LMP 10/15/2013 Physical Exam  Constitutional: She is oriented to person, place, and time. She appears well-developed and well-nourished.  HENT:  Head: Normocephalic.  Eyes: Pupils are equal, round, and reactive to light.  Neck:  Normal range of motion.  Cardiovascular: Normal rate and regular rhythm.   Pulmonary/Chest: Effort normal and breath sounds normal.  Abdominal: Soft. She exhibits no distension. Bowel sounds are increased. There is no tenderness.  Musculoskeletal: Normal range of motion.  Neurological: She is alert and oriented to person, place, and time.  Skin: Skin is warm and dry.  Nursing note and vitals reviewed.   ED Course  Procedures (including critical care time) Labs Review Labs Reviewed  CBC WITH  DIFFERENTIAL/PLATELET - Abnormal; Notable for the following:    RBC 5.73 (*)    MCV 73.8 (*)    MCH 23.9 (*)    Lymphocytes Relative 51 (*)    All other components within normal limits  COMPREHENSIVE METABOLIC PANEL - Abnormal; Notable for the following:    Calcium 8.8 (*)    Total Bilirubin 0.2 (*)    All other components within normal limits  LIPASE, BLOOD - Abnormal; Notable for the following:    Lipase 18 (*)    All other components within normal limits  URINALYSIS, ROUTINE W REFLEX MICROSCOPIC (NOT AT [facility]) - Abnormal; Notable for the following:    Specific Gravity, Urine 1.004 (*)    All other components within normal limits  POC URINE PREG, ED    Imaging Review No results found.   EKG Interpretation None     issue has been given a prescription for Imodium with instructions to use 2 mg after each loose bowel movement up to 8 mg in a 24-hour period.  She didn't place any knee sleeve for comfort.  She is ultimately prescription for Ultram  MDM   Final diagnoses:  Vomiting and diarrhea  Osteoarthritis of right knee, unspecified osteoarthritis type         Earley FavorGail Pretty Weltman, NP 02/17/15 [phone]  Marisa Severinlga Otter, MD 02/17/15 [phone]

## 2015-02-17 NOTE — Discharge Instructions (Signed)
You have been given a prescription for Imodium that you can use if you have subsequent bowel movements over the next 24 hours.  Take 2 mg after each loose bowel movement up to 8 mg in a 24-hour period

## 2015-02-18 ENCOUNTER — Encounter (HOSPITAL_COMMUNITY): Payer: Self-pay | Admitting: Emergency Medicine

## 2015-02-18 ENCOUNTER — Emergency Department (HOSPITAL_COMMUNITY)
Admission: EM | Admit: 2015-02-18 | Discharge: 2015-02-18 | Disposition: A | Discharge status: Home / Self Care | Payer: Medicaid Other | Attending: Emergency Medicine | Admitting: Emergency Medicine

## 2015-02-18 DIAGNOSIS — Z720 Tobacco use: Secondary | ICD-10-CM | POA: Diagnosis not present

## 2015-02-18 DIAGNOSIS — M109 Gout, unspecified: Secondary | ICD-10-CM | POA: Insufficient documentation

## 2015-02-18 DIAGNOSIS — Z8719 Personal history of other diseases of the digestive system: Secondary | ICD-10-CM | POA: Diagnosis not present

## 2015-02-18 DIAGNOSIS — M1711 Unilateral primary osteoarthritis, right knee: Secondary | ICD-10-CM | POA: Diagnosis not present

## 2015-02-18 DIAGNOSIS — I10 Essential (primary) hypertension: Secondary | ICD-10-CM | POA: Diagnosis not present

## 2015-02-18 DIAGNOSIS — Z590 Homelessness: Secondary | ICD-10-CM | POA: Insufficient documentation

## 2015-02-18 DIAGNOSIS — Z880 Allergy status to penicillin: Secondary | ICD-10-CM | POA: Diagnosis not present

## 2015-02-18 DIAGNOSIS — Z791 Long term (current) use of non-steroidal anti-inflammatories (NSAID): Secondary | ICD-10-CM | POA: Diagnosis not present

## 2015-02-18 DIAGNOSIS — M79604 Pain in right leg: Secondary | ICD-10-CM | POA: Diagnosis present

## 2015-02-18 NOTE — ED Provider Notes (Signed)
CSN: [id number]     Arrival date & time 02/18/15  1850 History  This chart was scribed for Earley Favor, NP, working with Rolan Bucco, MD by Chestine Spore, ED Scribe. The patient was seen in room WTR5/WTR5 at 8:28 PM.    Chief Complaint  Patient presents with  . Leg Pain      The history is provided by the patient. No language interpreter was used.     HPI Comments: Amy Conway is a 49 y.o. female with a medical hx of HTN and gout who presents to the Emergency Department complaining of bilateral leg pain onset 5 days. Pt was just recently seen at [facility] and was treated for gout and Rx several medications that she wasn't able to fill. Pt was given a knee sleeve that she took off and she notes that when she did she had more swelling to her knee. Pt is now requesting a knee immobilizer because they are more comfortable to her. She states that she is having associated symptoms of knee swelling, tingling sensation to left leg. She states that she has not tried any medications for the relief of her symptoms. She denies any other symptoms. Pt is f/u with her PCP 03/01/15  Past Medical History  Diagnosis Date  . Hypertension   . GERD (gastroesophageal reflux disease)   . Alcohol abuse   . Homelessness   . Gout    Past Surgical History  Procedure Laterality Date  . Cesarean section  1997   History reviewed. No pertinent family history. History  Substance Use Topics  . Smoking status: Current Every Day Smoker -- 1.00 packs/day for 29 years    Types: Cigarettes  . Smokeless tobacco: Never Used  . Alcohol Use: Yes   OB History    No data available     Review of Systems  Musculoskeletal: Positive for myalgias (bilateral leg) and joint swelling (both knees).      Allergies  Ibuprofen and Penicillins  Home Medications   Prior to Admission medications   Medication Sig Start Date End Date Taking? Authorizing Provider  naproxen sodium (ANAPROX) 220 MG tablet Take 220 mg by mouth  daily as needed (knee pain).   Yes Historical Provider, MD  loperamide (IMODIUM) 2 MG capsule Take 1 capsule (2 mg total) by mouth once. Patient not taking: Reported on 02/18/2015 02/17/15   Earley Favor, NP  oxyCODONE-acetaminophen (PERCOCET/ROXICET) 5-325 MG per tablet Take 1-2 tablets by mouth every 4 (four) hours as needed for moderate pain or severe pain. Patient not taking: Reported on 02/16/2015 10/26/14   Oswaldo Conroy, PA-C  traMADol (ULTRAM) 50 MG tablet Take 1 tablet (50 mg total) by mouth every 6 (six) hours as needed. 02/17/15   Earley Favor, NP   BP 114/66 mmHg  Pulse 100  Temp(Src) 99 F (37.2 C) (Oral)  Resp 18  SpO2 95%  LMP 10/15/2013 Physical Exam  Constitutional: She is oriented to person, place, and time. She appears well-developed and well-nourished. No distress.  HENT:  Head: Normocephalic and atraumatic.  Eyes: EOM are normal.  Neck: Neck supple. No tracheal deviation present.  Cardiovascular: Normal rate.   Pulmonary/Chest: Effort normal. No respiratory distress.  Musculoskeletal: Normal range of motion.       Left knee: She exhibits swelling.  Minimal medial swelling, crepitus, and pain with full flexion. Known osteoarthritis.   Neurological: She is alert and oriented to person, place, and time.  Skin: Skin is warm and dry.  Psychiatric: She has  a normal mood and affect. Her behavior is normal.  Nursing note and vitals reviewed.   ED Course  Procedures (including critical care time) DIAGNOSTIC STUDIES: Oxygen Saturation is 95% on RA, adequate by my interpretation.    COORDINATION OF CARE: 8:29 PM-Discussed treatment plan which includes knee immobolizer with pt at bedside and pt agreed to plan.   Labs Review Labs Reviewed - No data to display  Imaging Review No results found.   EKG Interpretation None     Should states she tried the knee sleeve without success.  She is requesting a knee immobilizer until she can seen by her primary care physician.  No  new symptoms MDM   Final diagnoses:  Osteoarthritis of right knee, unspecified osteoarthritis type     Earley FavorGail Finnbar Cedillos, NP 02/18/15 [phone]  Rolan BuccoMelanie Belfi, MD 02/18/15 2108

## 2015-02-18 NOTE — ED Notes (Signed)
Pt arrived to the ED with a complaint of bilateral leg pain.  [facility]  Two days ago and given medicine for gout but has been unable to fill prescription.  Pt states she has swelling to haer knees and feels a tingling sensation to the left leg.

## 2015-02-18 NOTE — Discharge Instructions (Signed)
Wear the knee immobilizer for comfort.  Make sure you keep your appointment June 15 with your primary care physician

## 2015-02-18 NOTE — ED Notes (Signed)
Ortho tech called for knee immobil

## 2015-08-09 ENCOUNTER — Emergency Department (HOSPITAL_COMMUNITY)
Admission: EM | Admit: 2015-08-09 | Discharge: 2015-08-09 | Discharge status: Against Medical Advice | Payer: Medicaid Other | Attending: Emergency Medicine | Admitting: Emergency Medicine

## 2015-08-09 ENCOUNTER — Encounter (HOSPITAL_COMMUNITY): Payer: Self-pay | Admitting: *Deleted

## 2015-08-09 DIAGNOSIS — Z590 Homelessness: Secondary | ICD-10-CM | POA: Diagnosis not present

## 2015-08-09 DIAGNOSIS — Z880 Allergy status to penicillin: Secondary | ICD-10-CM | POA: Diagnosis not present

## 2015-08-09 DIAGNOSIS — F17210 Nicotine dependence, cigarettes, uncomplicated: Secondary | ICD-10-CM | POA: Insufficient documentation

## 2015-08-09 DIAGNOSIS — Z8739 Personal history of other diseases of the musculoskeletal system and connective tissue: Secondary | ICD-10-CM | POA: Insufficient documentation

## 2015-08-09 DIAGNOSIS — Z8719 Personal history of other diseases of the digestive system: Secondary | ICD-10-CM | POA: Insufficient documentation

## 2015-08-09 DIAGNOSIS — Z79899 Other long term (current) drug therapy: Secondary | ICD-10-CM | POA: Diagnosis not present

## 2015-08-09 DIAGNOSIS — R519 Headache, unspecified: Secondary | ICD-10-CM

## 2015-08-09 DIAGNOSIS — R51 Headache: Secondary | ICD-10-CM | POA: Insufficient documentation

## 2015-08-09 DIAGNOSIS — I10 Essential (primary) hypertension: Secondary | ICD-10-CM | POA: Insufficient documentation

## 2015-08-09 DIAGNOSIS — R200 Anesthesia of skin: Secondary | ICD-10-CM | POA: Diagnosis present

## 2015-08-09 LAB — URINALYSIS, ROUTINE W REFLEX MICROSCOPIC
BILIRUBIN URINE: NEGATIVE
Glucose, UA: NEGATIVE mg/dL
KETONES UR: NEGATIVE mg/dL
LEUKOCYTES UA: NEGATIVE
NITRITE: NEGATIVE
Protein, ur: NEGATIVE mg/dL
Specific Gravity, Urine: 1.005 (ref 1.005–1.030)
pH: 5.5 (ref 5.0–8.0)

## 2015-08-09 LAB — CBC WITH DIFFERENTIAL/PLATELET
BASOS ABS: 0 10*3/uL (ref 0.0–0.1)
Basophils Relative: 0 %
EOS ABS: 0.1 10*3/uL (ref 0.0–0.7)
Eosinophils Relative: 2 %
HCT: 43.8 % (ref 36.0–46.0)
HEMOGLOBIN: 14.4 g/dL (ref 12.0–15.0)
LYMPHS PCT: 58 %
Lymphs Abs: 3.8 10*3/uL (ref 0.7–4.0)
MCH: 24.2 pg — AB (ref 26.0–34.0)
MCHC: 32.9 g/dL (ref 30.0–36.0)
MCV: 73.5 fL — ABNORMAL LOW (ref 78.0–100.0)
MONO ABS: 0.3 10*3/uL (ref 0.1–1.0)
Monocytes Relative: 4 %
NEUTROS PCT: 36 %
Neutro Abs: 2.3 10*3/uL (ref 1.7–7.7)
PLATELETS: 211 10*3/uL (ref 150–400)
RBC: 5.96 MIL/uL — AB (ref 3.87–5.11)
RDW: 14.7 % (ref 11.5–15.5)
WBC: 6.5 10*3/uL (ref 4.0–10.5)

## 2015-08-09 LAB — I-STAT CHEM 8, ED
BUN: 18 mg/dL (ref 6–20)
CALCIUM ION: 1.11 mmol/L — AB (ref 1.12–1.23)
CHLORIDE: 101 mmol/L (ref 101–111)
Creatinine, Ser: 1.2 mg/dL — ABNORMAL HIGH (ref 0.44–1.00)
Glucose, Bld: 88 mg/dL (ref 65–99)
HEMATOCRIT: 50 % — AB (ref 36.0–46.0)
Hemoglobin: 17 g/dL — ABNORMAL HIGH (ref 12.0–15.0)
POTASSIUM: 3.8 mmol/L (ref 3.5–5.1)
SODIUM: 141 mmol/L (ref 135–145)
TCO2: 27 mmol/L (ref 0–100)

## 2015-08-09 LAB — URINE MICROSCOPIC-ADD ON
Bacteria, UA: NONE SEEN
WBC UA: NONE SEEN WBC/hpf (ref 0–5)

## 2015-08-09 MED ORDER — ACETAMINOPHEN 325 MG PO TABS
650.0000 mg | ORAL_TABLET | Freq: Once | ORAL | Status: DC
  Filled 2015-08-09: qty 2

## 2015-08-09 NOTE — ED Notes (Addendum)
Pt now c/o chest pain and dizziness when she stands.

## 2015-08-09 NOTE — ED Notes (Signed)
Pt hit call bell, states that she wishes to leave. Explained that we are waiting for blood work to come back. Pt states that she wishes to leave. Explained she will have to sign out AMA. Pt agrees to do so. Pt began screaming into phone.

## 2015-08-09 NOTE — ED Notes (Signed)
Pt arrives via EMS from home. Pt states that around noon she started experiencing blurred vision, headache and right hand fingers tingling. A nurse came over and checked her BP and it was 200 systolic. EMS was called out this afternoon and BP was 220/138 and rechecked at 184 systolic. EMS reports that ETOH is on board, multiple family issues at the scene causing pt to become very anxious. Pt takes lisinopril for HTN, states she did take it today.

## 2015-08-09 NOTE — ED Provider Notes (Signed)
CSN: [id number]     Arrival date & time 08/09/15  2053 History   First MD Initiated Contact with Patient 08/09/15 2054     Chief Complaint  Patient presents with  . Hypertension     (Consider location/radiation/quality/duration/timing/severity/associated sxs/prior Treatment) HPI Comments: This is a homeless female who was seen by her "doctor today.  She doesn't know the doctor's name who told her that her blood pressure was up."  She states that it was written on a paper that she left at the hotel that she is staying at and she doesn't remember how high it was, but states that for the last 3 months.  Every time she is going to see this provider.  She's been told that her blood pressure is up.  No change in her medications which include lisinopril twice a day.  She called EMS tonight because she had a global headache, and dizziness.  She also reports that she has persistent numbness to the fingertips of her right hand on fingers 2, 3 and 4.  This is been present for the past 3-4 months.  She has not taken any medication.  She denies any visual disturbance, nausea or vomiting.  No recent URI symptoms.  No fever.  States she is eating on a regular basis  Patient is a 49 y.o. female presenting with hypertension. The history is provided by the patient.  Hypertension This is a new problem. The current episode started today. The problem has been unchanged. Associated symptoms include headaches and numbness. Pertinent negatives include no chest pain or fever. Nothing aggravates the symptoms. She has tried nothing for the symptoms. The treatment provided no relief.    Past Medical History  Diagnosis Date  . Hypertension   . GERD (gastroesophageal reflux disease)   . Alcohol abuse   . Homelessness   . Gout    Past Surgical History  Procedure Laterality Date  . Cesarean section  1997   No family history on file. Social History  Substance Use Topics  . Smoking status: Current Every Day Smoker --  1.00 packs/day for 29 years    Types: Cigarettes  . Smokeless tobacco: Never Used  . Alcohol Use: Yes   OB History    No data available     Review of Systems  Constitutional: Negative for fever.  Eyes: Negative for photophobia and visual disturbance.  Respiratory: Negative for shortness of breath.   Cardiovascular: Negative for chest pain.  Neurological: Positive for dizziness, numbness and headaches.  All other systems reviewed and are negative.     Allergies  Ibuprofen and Penicillins  Home Medications   Prior to Admission medications   Medication Sig Start Date End Date Taking? Authorizing Provider  metoprolol (LOPRESSOR) 50 MG tablet Take 50 mg by mouth 2 (two) times daily.   Yes Historical Provider, MD  loperamide (IMODIUM) 2 MG capsule Take 1 capsule (2 mg total) by mouth once. Patient not taking: Reported on 02/18/2015 02/17/15   Earley Favor, NP  oxyCODONE-acetaminophen (PERCOCET/ROXICET) 5-325 MG per tablet Take 1-2 tablets by mouth every 4 (four) hours as needed for moderate pain or severe pain. Patient not taking: Reported on 02/16/2015 10/26/14   Oswaldo Conroy, PA-C  traMADol (ULTRAM) 50 MG tablet Take 1 tablet (50 mg total) by mouth every 6 (six) hours as needed. Patient not taking: Reported on 08/09/2015 02/17/15   Earley Favor, NP   BP 141/82 mmHg  Pulse 67  Temp(Src) 97.4 F (36.3 C)  Resp 16  SpO2 99%  LMP 10/15/2013 Physical Exam  Constitutional: She is oriented to person, place, and time. She appears well-developed.  HENT:  Head: Normocephalic.  Mouth/Throat: Oropharynx is clear and moist.  Eyes: Pupils are equal, round, and reactive to light.  Neck: Normal range of motion.  Cardiovascular: Normal rate and regular rhythm.   Pulmonary/Chest: Effort normal and breath sounds normal.  Abdominal: Soft.  Musculoskeletal: Normal range of motion. She exhibits no edema or tenderness.  Neurological: She is alert and oriented to person, place, and time.  Skin:  Skin is warm.  Nursing note and vitals reviewed.   ED Course  Procedures (including critical care time) Labs Review Labs Reviewed  URINALYSIS, ROUTINE W REFLEX MICROSCOPIC (NOT AT [facility]) - Abnormal; Notable for the following:    Hgb urine dipstick SMALL (*)    All other components within normal limits  CBC WITH DIFFERENTIAL/PLATELET - Abnormal; Notable for the following:    RBC 5.96 (*)    MCV 73.5 (*)    MCH 24.2 (*)    All other components within normal limits  URINE MICROSCOPIC-ADD ON - Abnormal; Notable for the following:    Squamous Epithelial / LPF 0-5 (*)    All other components within normal limits  I-STAT CHEM 8, ED - Abnormal; Notable for the following:    Creatinine, Ser 1.20 (*)    Calcium, Ion 1.11 (*)    Hemoglobin 17.0 (*)    HCT 50.0 (*)    All other components within normal limits    Imaging Review No results found. I have personally reviewed and evaluated these images and lab results as part of my medical decision-making.   EKG Interpretation   Date/Time:  Wednesday August 09 2015 21:00:27 EST Ventricular Rate:  61 PR Interval:  167 QRS Duration: 88 QT Interval:  410 QTC Calculation: 413 R Axis:   37 Text Interpretation:  Sinus rhythm Probable left atrial enlargement no  significant change since Nov 2015 Confirmed by Criss AlvineGOLDSTON  MD, SCOTT ([id number])  on 08/09/2015 9:02:50 PM     On arrival BNOP 135/90 but still with global headache will check UA, I-stat, CBC and EKG She refused Tylenol and did not want to wait for blood work to be resulted MDM   Final diagnoses:  Nonintractable headache, unspecified chronicity pattern, unspecified headache type         Earley FavorGail Relena Ivancic, NP 08/09/15 2354  Pricilla LovelessScott Goldston, MD 08/10/15 0020

## 2015-11-23 ENCOUNTER — Emergency Department (INDEPENDENT_AMBULATORY_CARE_PROVIDER_SITE_OTHER)
Admission: EM | Admit: 2015-11-23 | Discharge: 2015-11-23 | Disposition: A | Discharge status: Home / Self Care | Payer: Medicaid Other | Source: Home / Self Care | Attending: Emergency Medicine | Admitting: Emergency Medicine

## 2015-11-23 ENCOUNTER — Encounter (HOSPITAL_COMMUNITY): Payer: Self-pay | Admitting: *Deleted

## 2015-11-23 DIAGNOSIS — R42 Dizziness and giddiness: Secondary | ICD-10-CM | POA: Diagnosis not present

## 2015-11-23 LAB — POCT URINALYSIS DIP (DEVICE)
Bilirubin Urine: NEGATIVE
Glucose, UA: NEGATIVE mg/dL
HGB URINE DIPSTICK: NEGATIVE
Ketones, ur: NEGATIVE mg/dL
LEUKOCYTES UA: NEGATIVE
Nitrite: NEGATIVE
Protein, ur: NEGATIVE mg/dL
SPECIFIC GRAVITY, URINE: 1.015 (ref 1.005–1.030)
UROBILINOGEN UA: 0.2 mg/dL (ref 0.0–1.0)
pH: 5.5 (ref 5.0–8.0)

## 2015-11-23 NOTE — Discharge Instructions (Signed)
Dizziness Your BP today is 134/98  Your urine test is negative Dizziness is a common problem. It makes you feel unsteady or lightheaded. You may feel like you are about to pass out (faint). Dizziness can lead to injury if you stumble or fall. Anyone can get dizzy, but dizziness is more common in older adults. This condition can be caused by a number of things, including:  Medicines.  Dehydration.  Illness. HOME CARE Following these instructions may help with your condition: Eating and Drinking  Drink enough fluid to keep your pee (urine) clear or pale yellow. This helps to keep you from getting dehydrated. Try to drink more clear fluids, such as water.  Do not drink alcohol.  Limit how much caffeine you drink or eat if told by your doctor.  Limit how much salt you drink or eat if told by your doctor. Activity  Avoid making quick movements.  When you stand up from sitting in a chair, steady yourself until you feel okay.  In the morning, first sit up on the side of the bed. When you feel okay, stand slowly while you hold onto something. Do this until you know that your balance is fine.  Move your legs often if you need to stand in one place for a long time. Tighten and relax your muscles in your legs while you are standing.  Do not drive or use heavy machinery if you feel dizzy.  Avoid bending down if you feel dizzy. Place items in your home so that they are easy for you to reach without leaning over. Lifestyle  Do not use any tobacco products, including cigarettes, chewing tobacco, or electronic cigarettes. If you need help quitting, ask your doctor.  Try to lower your stress level, such as with yoga or meditation. Talk with your doctor if you need help. General Instructions  Watch your dizziness for any changes.  Take medicines only as told by your doctor. Talk with your doctor if you think that your dizziness is caused by a medicine that you are taking.  Tell a friend or  a family member that you are feeling dizzy. If he or she notices any changes in your behavior, have this person call your doctor.  Keep all follow-up visits as told by your doctor. This is important. GET HELP IF:  Your dizziness does not go away.  Your dizziness or light-headedness gets worse.  You feel sick to your stomach (nauseous).  You have trouble hearing.  You have new symptoms.  You are unsteady on your feet or you feel like the room is spinning. GET HELP RIGHT AWAY IF:  You throw up (vomit) or have diarrhea and are unable to eat or drink anything.  You have trouble:  Talking.  Walking.  Swallowing.  Using your arms, hands, or legs.  You feel generally weak.  You are not thinking clearly or you have trouble forming sentences. It may take a friend or family member to notice this.  You have:  Chest pain.  Pain in your belly (abdomen).  Shortness of breath.  Sweating.  Your vision changes.  You are bleeding.  You have a headache.  You have neck pain or a stiff neck.  You have a fever.   This information is not intended to replace advice given to you by your health care provider. Make sure you discuss any questions you have with your health care provider.   Document Released: 08/22/2011 Document Revised: 01/17/2015 Document Reviewed: 08/29/2014 Elsevier  Interactive Patient Education ©2016 [employer] Inc. ° °

## 2015-11-23 NOTE — ED Provider Notes (Signed)
CSN: [id number]     Arrival date & time 11/23/15  1259 History   First MD Initiated Contact with Patient 11/23/15 1314     Chief Complaint  Patient presents with  . Dizziness   (Consider location/radiation/quality/duration/timing/severity/associated sxs/prior Treatment) HPI History obtained from patient:  Pt presents with the cc [id number], elevated BP Duration of symptoms:2 days Treatment prior to arrival:none Was seen by nurse yesterday, and BP was elevated. Was advised to come to UC for BP Pain score:0 Other symptoms include:urine dark, and smelly  Past Medical History  Diagnosis Date  . Hypertension   . GERD (gastroesophageal reflux disease)   . Alcohol abuse   . Homelessness   . Gout    Past Surgical History  Procedure Laterality Date  . Cesarean section  1997   History reviewed. No pertinent family history. Social History  Substance Use Topics  . Smoking status: Current Every Day Smoker -- 1.00 packs/day for 29 years    Types: Cigarettes  . Smokeless tobacco: Never Used  . Alcohol Use: Yes   OB History    No data available     Review of Systems dizziness Allergies  Ibuprofen and Penicillins  Home Medications   Prior to Admission medications   Medication Sig Start Date End Date Taking? Authorizing Provider  loperamide (IMODIUM) 2 MG capsule Take 1 capsule (2 mg total) by mouth once. Patient not taking: Reported on 02/18/2015 02/17/15   Earley Favor, NP  metoprolol (LOPRESSOR) 50 MG tablet Take 50 mg by mouth 2 (two) times daily.    Historical Provider, MD  oxyCODONE-acetaminophen (PERCOCET/ROXICET) 5-325 MG per tablet Take 1-2 tablets by mouth every 4 (four) hours as needed for moderate pain or severe pain. Patient not taking: Reported on 02/16/2015 10/26/14   Oswaldo Conroy, PA-C  traMADol (ULTRAM) 50 MG tablet Take 1 tablet (50 mg total) by mouth every 6 (six) hours as needed. Patient not taking: Reported on 08/09/2015 02/17/15   Earley Favor, NP   Meds  Ordered and Administered this Visit  Medications - No data to display  BP 134/98 mmHg  Pulse 106  Temp(Src) 97.7 F (36.5 C) (Oral)  Resp 18  SpO2 97%  LMP 10/15/2013 No data found.   Physical Exam NURSES NOTES AND VITAL SIGNS REVIEWED. CONSTITUTIONAL: Well developed, well nourished, no acute distress HEENT: normocephalic, atraumatic, right and left TM's are normal EYES: Conjunctiva normal NECK:normal ROM, supple, no adenopathy PULMONARY:No respiratory distress, normal effort, Lungs: CTAb/l, no wheezes, or increased work of breathing CARDIOVASCULAR: RRR, no murmur ABDOMEN: soft, ND, NT, +'ve BS MUSCULOSKELETAL: Normal ROM of all extremities,  SKIN: warm and dry without rash PSYCHIATRIC: Mood and affect, behavior are normal  ED Course  Procedures (including critical care time)  Labs Review Labs Reviewed  POCT URINALYSIS DIP (DEVICE)    Imaging Review No results found.   Visual Acuity Review  Right Eye Distance:   Left Eye Distance:   Bilateral Distance:    Right Eye Near:   Left Eye Near:    Bilateral Near:        UA is normal discussed treatment plan with patient no worrying features at this time. BP should be rechecked in the near future as diastolic pressure is high.  MDM   1. Dizziness, nonspecific    Patient is reassured that there are no issues that require transfer to higher level of care at this time.  Patient is advised to continue home symptomatic treatment. Prescription is sent to  pharmacy patient  has indicated.  Patient is advised that if there are new or worsening symptoms or attend the emergency department, or contact primary care provider. Instructions of care provided discharged home in stable condition. Return to work/school note provided.  THIS NOTE WAS GENERATED USING A VOICE RECOGNITION SOFTWARE PROGRAM. ALL REASONABLE EFFORTS  WERE MADE TO PROOFREAD THIS DOCUMENT FOR ACCURACY.     Tharon AquasFrank C Patrick, PA 11/23/15 1414

## 2015-11-23 NOTE — ED Notes (Signed)
Pt  Has  Multiple  Symptoms to  Include  Cough  /  Congested     Symptoms  X  2  Weeks       Dizzyness      As  Well      Pt    reprts      She  Got  A  Flu  Shot  This  Year        She  Is   Sitting   Upright   On the  Exam table  Appearing in no  Severe  Distress

## 2016-10-14 ENCOUNTER — Emergency Department (HOSPITAL_COMMUNITY): Payer: Medicaid Other

## 2016-10-14 ENCOUNTER — Encounter (HOSPITAL_COMMUNITY): Payer: Self-pay | Admitting: Neurology

## 2016-10-14 ENCOUNTER — Emergency Department (HOSPITAL_COMMUNITY)
Admission: EM | Admit: 2016-10-14 | Discharge: 2016-10-14 | Disposition: A | Discharge status: Home / Self Care | Payer: Medicaid Other | Attending: Emergency Medicine | Admitting: Emergency Medicine

## 2016-10-14 DIAGNOSIS — I10 Essential (primary) hypertension: Secondary | ICD-10-CM | POA: Insufficient documentation

## 2016-10-14 DIAGNOSIS — F17210 Nicotine dependence, cigarettes, uncomplicated: Secondary | ICD-10-CM | POA: Diagnosis not present

## 2016-10-14 DIAGNOSIS — Z79899 Other long term (current) drug therapy: Secondary | ICD-10-CM | POA: Insufficient documentation

## 2016-10-14 DIAGNOSIS — N390 Urinary tract infection, site not specified: Secondary | ICD-10-CM

## 2016-10-14 DIAGNOSIS — R102 Pelvic and perineal pain: Secondary | ICD-10-CM | POA: Diagnosis present

## 2016-10-14 DIAGNOSIS — N950 Postmenopausal bleeding: Secondary | ICD-10-CM | POA: Diagnosis not present

## 2016-10-14 DIAGNOSIS — N760 Acute vaginitis: Secondary | ICD-10-CM

## 2016-10-14 LAB — URINALYSIS, ROUTINE W REFLEX MICROSCOPIC
Bilirubin Urine: NEGATIVE
Glucose, UA: NEGATIVE mg/dL
KETONES UR: NEGATIVE mg/dL
Nitrite: NEGATIVE
PH: 5 (ref 5.0–8.0)
Protein, ur: 30 mg/dL — AB
SPECIFIC GRAVITY, URINE: 1.028 (ref 1.005–1.030)

## 2016-10-14 LAB — CBC WITH DIFFERENTIAL/PLATELET
BASOS ABS: 0 10*3/uL (ref 0.0–0.1)
Basophils Relative: 0 %
EOS ABS: 0.1 10*3/uL (ref 0.0–0.7)
EOS PCT: 1 %
HCT: 42.9 % (ref 36.0–46.0)
Hemoglobin: 13.3 g/dL (ref 12.0–15.0)
Lymphocytes Relative: 26 %
Lymphs Abs: 1.9 10*3/uL (ref 0.7–4.0)
MCH: 23.4 pg — ABNORMAL LOW (ref 26.0–34.0)
MCHC: 31 g/dL (ref 30.0–36.0)
MCV: 75.5 fL — ABNORMAL LOW (ref 78.0–100.0)
MONO ABS: 0.5 10*3/uL (ref 0.1–1.0)
Monocytes Relative: 7 %
Neutro Abs: 4.7 10*3/uL (ref 1.7–7.7)
Neutrophils Relative %: 66 %
PLATELETS: 219 10*3/uL (ref 150–400)
RBC: 5.68 MIL/uL — AB (ref 3.87–5.11)
RDW: 15.7 % — AB (ref 11.5–15.5)
WBC: 7.1 10*3/uL (ref 4.0–10.5)

## 2016-10-14 LAB — COMPREHENSIVE METABOLIC PANEL
ALK PHOS: 110 U/L (ref 38–126)
ALT: 25 U/L (ref 14–54)
AST: 34 U/L (ref 15–41)
Albumin: 3.7 g/dL (ref 3.5–5.0)
Anion gap: 8 (ref 5–15)
BILIRUBIN TOTAL: 0.5 mg/dL (ref 0.3–1.2)
BUN: 12 mg/dL (ref 6–20)
CO2: 25 mmol/L (ref 22–32)
CREATININE: 0.82 mg/dL (ref 0.44–1.00)
Calcium: 9.1 mg/dL (ref 8.9–10.3)
Chloride: 103 mmol/L (ref 101–111)
GFR calc Af Amer: >60 mL/min (ref >60)
GFR calc non Af Amer: >60 mL/min (ref >60)
Glucose, Bld: 120 mg/dL — ABNORMAL HIGH (ref 65–99)
Potassium: 4 mmol/L (ref 3.5–5.1)
Sodium: 136 mmol/L (ref 135–145)
TOTAL PROTEIN: 7.2 g/dL (ref 6.5–8.1)

## 2016-10-14 LAB — WET PREP, GENITAL
CLUE CELLS WET PREP: NONE SEEN
Sperm: NONE SEEN
Trich, Wet Prep: NONE SEEN
WBC WET PREP: NONE SEEN
YEAST WET PREP: NONE SEEN

## 2016-10-14 LAB — I-STAT BETA HCG BLOOD, ED (MC, WL, AP ONLY): I-stat hCG, quantitative: <5 m[IU]/mL (ref <5)

## 2016-10-14 MED ORDER — FLUCONAZOLE 100 MG PO TABS
150.0000 mg | ORAL_TABLET | Freq: Once | ORAL | Status: AC
  Administered 2016-10-14: 150 mg via ORAL
  Filled 2016-10-14: qty 2

## 2016-10-14 MED ORDER — MORPHINE SULFATE (PF) 4 MG/ML IV SOLN
4.0000 mg | Freq: Once | INTRAVENOUS | Status: AC
  Administered 2016-10-14: 4 mg via INTRAVENOUS
  Filled 2016-10-14: qty 1

## 2016-10-14 MED ORDER — CEPHALEXIN 500 MG PO CAPS: 500.0000 mg | ORAL_CAPSULE | Freq: Three times a day (TID) | ORAL | 0 refills | Status: DC

## 2016-10-14 MED ORDER — OXYCODONE-ACETAMINOPHEN 5-325 MG PO TABS: 1.0000 | ORAL_TABLET | Freq: Once | ORAL | Status: DC

## 2016-10-14 MED ORDER — DEXTROSE 5 % IV SOLN
1.0000 g | Freq: Once | INTRAVENOUS | Status: AC
  Administered 2016-10-14: 1 g via INTRAVENOUS
  Filled 2016-10-14: qty 10

## 2016-10-14 NOTE — ED Triage Notes (Signed)
Pt reports pelvic pain, dysuria, odor x 2 weeks. Thinks she may have a UTI. Reports vaginal discharge, brown. Ha not have a period in 5 years.

## 2016-10-14 NOTE — ED Notes (Signed)
Patient transported to Ultrasound 

## 2016-10-14 NOTE — Discharge Instructions (Signed)
Read the information below.  Use the prescribed medication as directed.  Please discuss all new medications with your pharmacist.  You may return to the Emergency Department at any time for worsening condition or any new symptoms that concern you.   Do not put anything else in your vagina while it is healing.  If you continue to have bleeding please see a gynecologist for further evaluation.  Take all of the antibiotics until they are gone.  If you develop high fevers, worsening abdominal pain, uncontrolled vomiting, or are unable to tolerate fluids by mouth, return to the ER for a recheck.

## 2016-10-14 NOTE — ED Provider Notes (Signed)
[facility]EMER[facility]ENCY DEPT Provider Note   CSN: [id number] Arrival date & time: 10/14/16  [id number]     History   Chief Complaint Chief Complaint  Patient presents with  . Pelvic Pain  . Dysuria    HPI Amy Conway is a 51 y.o. female.  HPI   Pt with hx HTN, GERD, alcohol abuse p/w lower abdominal pain, vaginal pain, dysuria, abnormal vaginal discharge, vaginal bleeding.  She has had dysuria x 2 weeks.  Vaginal bleeding x 3 weeks, had not previously had a period in 5 years.  Because of the bleeding and dysuria, pt began using different substances in her vagina to try to help her symptoms including peroxide, vagisil cream, and vinegar.  Has taken tylenol for pain.  Since using those substances she has had brown discharge and her vaginal has been swollen and tender.  Has had lower abdominal pain, bilateral back pain, nausea x 4-5 days.  Has also been constipated.   Denies fevers, vomiting, weight loss.  Pt was sexually active January 9th, used protection.    Of note, pt is a poor historian.    Past Medical History:  Diagnosis Date  . Alcohol abuse   . GERD (gastroesophageal reflux disease)   . Gout   . Homelessness   . Hypertension     Patient Active Problem List   Diagnosis Date Noted  . Syncope 05/24/2014  . Alcohol abuse 04/01/2014  . Viral URI 06/23/2013    Past Surgical History:  Procedure Laterality Date  . CESAREAN SECTION  1997    OB History    No data available       Home Medications    Prior to Admission medications   Medication Sig Start Date End Date Taking? Authorizing Provider  acetaminophen (TYLENOL) 500 MG tablet Take 1,000 mg by mouth every 6 (six) hours as needed for moderate pain.   Yes Historical Provider, MD  metoprolol (LOPRESSOR) 50 MG tablet Take 50 mg by mouth 2 (two) times daily.   Yes Historical Provider, MD  cephALEXin (KEFLEX) 500 MG capsule Take 1 capsule (500 mg total) by mouth 3 (three) times daily. 10/14/16   Trixie DredgeEmily Jaisha Villacres, PA-C    loperamide (IMODIUM) 2 MG capsule Take 1 capsule (2 mg total) by mouth once. Patient not taking: Reported on 02/18/2015 02/17/15   Earley FavorGail Schulz, NP  oxyCODONE-acetaminophen (PERCOCET/ROXICET) 5-325 MG per tablet Take 1-2 tablets by mouth every 4 (four) hours as needed for moderate pain or severe pain. Patient not taking: Reported on 02/16/2015 10/26/14   Oswaldo ConroyVictoria Creech, PA-C  traMADol (ULTRAM) 50 MG tablet Take 1 tablet (50 mg total) by mouth every 6 (six) hours as needed. Patient not taking: Reported on 08/09/2015 02/17/15   Earley FavorGail Schulz, NP    Family History No family history on file.  Social History Social History  Substance Use Topics  . Smoking status: Current Every Day Smoker    Packs/day: 1.00    Years: 29.00    Types: Cigarettes  . Smokeless tobacco: Never Used  . Alcohol use Yes     Allergies   Ibuprofen and Penicillins   Review of Systems Review of Systems  All other systems reviewed and are negative.    Physical Exam Updated Vital Signs BP 117/80   Pulse 64   Temp 97.9 F (36.6 C) (Oral)   Resp 18   Ht 5\' 1"  (1.549 m)   Wt 80.7 kg   LMP 10/15/2013   SpO2 99%   BMI 33.63 kg/m  Physical Exam  Constitutional: She appears well-developed and well-nourished. No distress.  HENT:  Head: Normocephalic and atraumatic.  Neck: Neck supple.  Cardiovascular: Normal rate and regular rhythm.   Pulmonary/Chest: Effort normal and breath sounds normal. No respiratory distress. She has no wheezes. She has no rales.  Abdominal: Soft. She exhibits no distension. There is tenderness in the right upper quadrant, right lower quadrant and suprapubic area. There is no rebound and no guarding.  Genitourinary:  Genitourinary Comments: External genitalia normal.  Vaginal introitus tender to palpation.    Neurological: She is alert.  Skin: She is not diaphoretic.  Nursing note and vitals reviewed.    ED Treatments / Results  Labs (all labs ordered are listed, but only abnormal  results are displayed) Labs Reviewed  URINALYSIS, ROUTINE W REFLEX MICROSCOPIC - Abnormal; Notable for the following:       Result Value   Color, Urine AMBER (*)    APPearance CLOUDY (*)    Hgb urine dipstick MODERATE (*)    Protein, ur 30 (*)    Leukocytes, UA LARGE (*)    Bacteria, UA MANY (*)    Squamous Epithelial / LPF 6-30 (*)    All other components within normal limits  COMPREHENSIVE METABOLIC PANEL - Abnormal; Notable for the following:    Glucose, Bld 120 (*)    All other components within normal limits  CBC WITH DIFFERENTIAL/PLATELET - Abnormal; Notable for the following:    RBC 5.68 (*)    MCV 75.5 (*)    MCH 23.4 (*)    RDW 15.7 (*)    All other components within normal limits  WET PREP, GENITAL  URINE CULTURE  RPR  HIV ANTIBODY (ROUTINE TESTING)  I-STAT BETA HCG BLOOD, ED ([facility], [facility], [facility] ONLY)  GC/CHLAMYDIA PROBE AMP ([place]) NOT AT [facility]    EKG  EKG Interpretation None       Radiology US Transvaginal Non-ob  Result Date: 10/14/2016 CLINICAL DATA:  Postmenopausal bleeding for 3 weeks. EXAM: TRANSABDOMINAL AND TRANSVAGINAL ULTRASOUND OF PELVIS TECHNIQUE: Both transabdominal and transvaginal ultrasound examinations of the pelvis were performed. Transabdominal technique was performed for global imaging of the pelvis including uterus, ovaries, adnexal regions, and pelvic cul-de-sac. It was necessary to proceed with endovaginal exam following the transabdominal exam to visualize the endometrium and ovaries. COMPARISON:  None FINDINGS: Uterus Measurements: 8.3 x 3.8 x 4.8 cm. No fibroids identified. Not well visualized transabdominally due to uterine lie and patient's inability to tolerate transvaginal sonography. Endometrium Thickness: 15 mm on transabdominal sonography. Not visualized transvaginally due to uterine lie and patient's inability to tolerate transvaginal sonography. Right ovary Measurements: Not directly visualized, however no adnexal mass identified. Left  ovary Measurements: Not directly visualized, however no adnexal mass identified. Other findings No abnormal free fluid. IMPRESSION: Suboptimal exam due to patient's inability to tolerate transvaginal sonography. Endometrial thickness measures approximately 15 mm transabdominally. In the setting of post-menopausal bleeding, endometrial sampling is indicated to exclude carcinoma. (Ref: Radiological Reasoning: Algorithmic Workup of Abnormal Vaginal Bleeding with Endovaginal Sonography and Sonohysterography. AJR 2008; 161:W96-04). Electronically Signed   By: Myles Rosenthal M.D.   On: 10/14/2016 12:33   US Pelvis Complete  Result Date: 10/14/2016 CLINICAL DATA:  Postmenopausal bleeding for 3 weeks. EXAM: TRANSABDOMINAL AND TRANSVAGINAL ULTRASOUND OF PELVIS TECHNIQUE: Both transabdominal and transvaginal ultrasound examinations of the pelvis were performed. Transabdominal technique was performed for global imaging of the pelvis including uterus, ovaries, adnexal regions, and pelvic cul-de-sac. It was necessary to proceed  with endovaginal exam following the transabdominal exam to visualize the endometrium and ovaries. COMPARISON:  None FINDINGS: Uterus Measurements: 8.3 x 3.8 x 4.8 cm. No fibroids identified. Not well visualized transabdominally due to uterine lie and patient's inability to tolerate transvaginal sonography. Endometrium Thickness: 15 mm on transabdominal sonography. Not visualized transvaginally due to uterine lie and patient's inability to tolerate transvaginal sonography. Right ovary Measurements: Not directly visualized, however no adnexal mass identified. Left ovary Measurements: Not directly visualized, however no adnexal mass identified. Other findings No abnormal free fluid. IMPRESSION: Suboptimal exam due to patient's inability to tolerate transvaginal sonography. Endometrial thickness measures approximately 15 mm transabdominally. In the setting of post-menopausal bleeding, endometrial sampling is  indicated to exclude carcinoma. (Ref: Radiological Reasoning: Algorithmic Workup of Abnormal Vaginal Bleeding with Endovaginal Sonography and Sonohysterography. AJR 2008; 161:W96-04). Electronically Signed   By: Myles Rosenthal M.D.   On: 10/14/2016 12:33    Procedures Procedures (including critical care time)  Medications Ordered in ED Medications  fluconazole (DIFLUCAN) tablet 150 mg (not administered)  cefTRIAXone (ROCEPHIN) 1 g in dextrose 5 % 50 mL IVPB (0 g Intravenous Stopped 10/14/16 1220)  morphine 4 MG/ML injection 4 mg (4 mg Intravenous Given 10/14/16 1149)     Initial Impression / Assessment and Plan / ED Course  I have reviewed the triage vital signs and the nursing notes.  Pertinent labs & imaging results that were available during my care of the patient were reviewed by me and considered in my medical decision making (see chart for details).     Afebrile, nontoxic patient with abdominal/pelvic pain, dysuria, vaginal pain and discharge.  Pt put substances such as peroxide in her vagina when she had dysuria and I believe may have caused the vaginitis.  Unclear if there was actual bleeding or brown discharge - I did not see blood on swab or external exam.  Will treat with diflucan.  Pt unable to tolerate pelvic exam but wet prep swab obtained.  Pt given rocephin in ED.   D/C home with keflex, PCP and GYN follow up.  Discussed result, findings, treatment, and follow up  with patient.  Pt given return precautions.  Pt verbalizes understanding and agrees with plan.       Final Clinical Impressions(s) / ED Diagnoses   Final diagnoses:  Postmenopausal bleeding  Lower urinary tract infectious disease  Acute vaginitis    New Prescriptions New Prescriptions   CEPHALEXIN (KEFLEX) 500 MG CAPSULE    Take 1 capsule (500 mg total) by mouth 3 (three) times daily.     Trixie Dredge, PA-C 10/14/16 1338    Gwyneth Sprout, MD 10/14/16 2209

## 2016-10-15 LAB — URINE CULTURE: Culture: NO GROWTH

## 2016-10-15 LAB — RPR: RPR Ser Ql: NONREACTIVE

## 2016-10-15 LAB — HIV ANTIBODY (ROUTINE TESTING W REFLEX): HIV Screen 4th Generation wRfx: NONREACTIVE

## 2017-01-19 ENCOUNTER — Encounter (HOSPITAL_COMMUNITY): Payer: Self-pay | Admitting: Emergency Medicine

## 2017-01-19 ENCOUNTER — Emergency Department (HOSPITAL_COMMUNITY)
Admission: EM | Admit: 2017-01-19 | Discharge: 2017-01-19 | Disposition: A | Discharge status: Home / Self Care | Payer: Medicaid Other | Attending: Emergency Medicine | Admitting: Emergency Medicine

## 2017-01-19 ENCOUNTER — Emergency Department (HOSPITAL_COMMUNITY): Payer: Medicaid Other

## 2017-01-19 DIAGNOSIS — M25561 Pain in right knee: Secondary | ICD-10-CM | POA: Insufficient documentation

## 2017-01-19 DIAGNOSIS — N3000 Acute cystitis without hematuria: Secondary | ICD-10-CM | POA: Insufficient documentation

## 2017-01-19 DIAGNOSIS — R300 Dysuria: Secondary | ICD-10-CM | POA: Diagnosis present

## 2017-01-19 DIAGNOSIS — I10 Essential (primary) hypertension: Secondary | ICD-10-CM | POA: Diagnosis not present

## 2017-01-19 DIAGNOSIS — Z79899 Other long term (current) drug therapy: Secondary | ICD-10-CM | POA: Diagnosis not present

## 2017-01-19 DIAGNOSIS — F17210 Nicotine dependence, cigarettes, uncomplicated: Secondary | ICD-10-CM | POA: Insufficient documentation

## 2017-01-19 DIAGNOSIS — M25562 Pain in left knee: Secondary | ICD-10-CM | POA: Diagnosis not present

## 2017-01-19 LAB — I-STAT CHEM 8, ED
BUN: 7 mg/dL (ref 6–20)
Calcium, Ion: 1.01 mmol/L — ABNORMAL LOW (ref 1.15–1.40)
Chloride: 105 mmol/L (ref 101–111)
Creatinine, Ser: 0.9 mg/dL (ref 0.44–1.00)
Glucose, Bld: 80 mg/dL (ref 65–99)
HCT: 44 % (ref 36.0–46.0)
HEMOGLOBIN: 15 g/dL (ref 12.0–15.0)
POTASSIUM: 3.9 mmol/L (ref 3.5–5.1)
SODIUM: 141 mmol/L (ref 135–145)
TCO2: 22 mmol/L (ref 0–100)

## 2017-01-19 LAB — URINALYSIS, ROUTINE W REFLEX MICROSCOPIC
BILIRUBIN URINE: NEGATIVE
Glucose, UA: NEGATIVE mg/dL
KETONES UR: NEGATIVE mg/dL
Nitrite: NEGATIVE
PROTEIN: NEGATIVE mg/dL
Specific Gravity, Urine: 1.003 — ABNORMAL LOW (ref 1.005–1.030)
pH: 5 (ref 5.0–8.0)

## 2017-01-19 MED ORDER — CEPHALEXIN 500 MG PO CAPS: 500.0000 mg | ORAL_CAPSULE | Freq: Three times a day (TID) | ORAL | 0 refills | Status: DC

## 2017-01-19 NOTE — ED Notes (Signed)
Patient transported to X-ray 

## 2017-01-19 NOTE — ED Triage Notes (Signed)
Pt presents with bilat knee pain and lower back pain x 3 weeks; pt reports her knees will randomly "give out"

## 2017-01-19 NOTE — ED Provider Notes (Signed)
[facility]PT Provider Note   CSN: [id number] Arrival date & time: 01/19/17  0037     History   Chief Complaint Chief Complaint  Patient presents with  . Knee Pain  . Back Pain    lower    HPI Amy Conway is a 51 y.o. female.  Patient presents to the emergency department with chief complaint of bilateral chronic knee pain. She reports that she has a history of osteoarthritis. She has not been able to follow-up with an orthopedic doctor. She has not tried taking anything for her symptoms. She states that occasionally her knees will give out on her because of pain. Additionally, she complains of dysuria for about one week. She states that she has some pain that radiates into her low back. She denies any bowel or bladder incontinence. Denies any fevers, chills, nausea, or vomiting. She has not taken anything for these symptoms either.   The history is provided by the patient. No language interpreter was used.    Past Medical History:  Diagnosis Date  . Alcohol abuse   . GERD (gastroesophageal reflux disease)   . Gout   . Homelessness   . Hypertension     Patient Active Problem List   Diagnosis Date Noted  . Syncope 05/24/2014  . Alcohol abuse 04/01/2014  . Viral URI 06/23/2013    Past Surgical History:  Procedure Laterality Date  . CESAREAN SECTION  1997    OB History    No data available       Home Medications    Prior to Admission medications   Medication Sig Start Date End Date Taking? Authorizing Provider  acetaminophen (TYLENOL) 500 MG tablet Take 1,000 mg by mouth every 6 (six) hours as needed for moderate pain.    [provider]  cephALEXin (KEFLEX) 500 MG capsule Take 1 capsule (500 mg total) by mouth 3 (three) times daily. 01/19/17   Roxy HorsemanBrowning, Aleiah Mohammed, PA-C  loperamide (IMODIUM) 2 MG capsule Take 1 capsule (2 mg total) by mouth once. Patient not taking: Reported on 02/18/2015 02/17/15   Earley FavorSchulz, Gail, NP  metoprolol (LOPRESSOR) 50 MG  tablet Take 50 mg by mouth 2 (two) times daily.    [provider]  oxyCODONE-acetaminophen (PERCOCET/ROXICET) 5-325 MG per tablet Take 1-2 tablets by mouth every 4 (four) hours as needed for moderate pain or severe pain. Patient not taking: Reported on 02/16/2015 10/26/14   Oswaldo Conroyreech, Victoria, PA-C  traMADol (ULTRAM) 50 MG tablet Take 1 tablet (50 mg total) by mouth every 6 (six) hours as needed. Patient not taking: Reported on 08/09/2015 02/17/15   Earley FavorSchulz, Gail, NP    Family History History reviewed. No pertinent family history.  Social History Social History  Substance Use Topics  . Smoking status: Current Every Day Smoker    Packs/day: 1.00    Years: 29.00    Types: Cigarettes  . Smokeless tobacco: Never Used  . Alcohol use Yes     Allergies   Ibuprofen and Penicillins   Review of Systems Review of Systems  All other systems reviewed and are negative.    Physical Exam Updated Vital Signs BP 127/79   Pulse 81   Temp 97.6 F (36.4 C) (Oral)   Resp 18   Ht 5\' 1"  (1.549 m)   Wt 81.6 kg   LMP 10/15/2013   SpO2 99%   BMI 34.01 kg/m   Physical Exam  Nursing note and vitals reviewed.  Constitutional: Pt appears well-developed and well-nourished. No distress.  HENT:  Head: Normocephalic and atraumatic.  Eyes: Conjunctivae are normal.  Neck: Normal range of motion.  Cardiovascular: Normal rate, regular rhythm. Intact distal pulses.   Capillary refill < 3 sec.  Pulmonary/Chest: Effort normal and breath sounds normal.  Abdomen: No focal abdominal tenderness, no RLQ tenderness or pain at McBurney's point, no RUQ tenderness or Murphy's sign, no left-sided abdominal tenderness, no fluid wave, or signs of peritonitis Musculoskeletal:  BLE  Pt exhibits bilateral knee tenderness to palpation, no focal bony abnormality or deformity.   ROM: 5/5 bilaterally  Strength: 5/5 bilaterally  Neurological: Pt  is alert. Coordination normal.  Sensation: 5/5 bilaterally    Skin: Skin is warm and dry. Pt is not diaphoretic.  No evidence of open wound or skin tenting Psychiatric: Pt has a normal mood and affect.    ED Treatments / Results  Labs (all labs ordered are listed, but only abnormal results are displayed) Labs Reviewed  URINALYSIS, ROUTINE W REFLEX MICROSCOPIC - Abnormal; Notable for the following:       Result Value   Specific Gravity, Urine 1.003 (*)    Hgb urine dipstick SMALL (*)    Leukocytes, UA LARGE (*)    Bacteria, UA FEW (*)    Squamous Epithelial / LPF 6-30 (*)    All other components within normal limits  I-STAT CHEM 8, ED - Abnormal; Notable for the following:    Calcium, Ion 1.01 (*)    All other components within normal limits    EKG  EKG Interpretation None       Radiology Dg Knee Complete 4 Views Left  Result Date: 01/19/2017 CLINICAL DATA:  Nontraumatic left knee pain for several months EXAM: LEFT KNEE - COMPLETE 4+ VIEW COMPARISON:  None. FINDINGS: Negative for fracture or dislocation. Mild osteoarthritic changes are present, greatest in the lateral compartment. No bone lesion or bony destruction. No acute soft tissue abnormality. IMPRESSION: Osteoarthritis.  No acute findings. Electronically Signed   By: Ellery Plunk M.D.   On: 01/19/2017 03:07   Dg Knee Complete 4 Views Right  Result Date: 01/19/2017 CLINICAL DATA:  Nontraumatic right knee pain for several months EXAM: RIGHT KNEE - COMPLETE 4+ VIEW COMPARISON:  None. FINDINGS: Negative for acute fracture or dislocation. No bone lesion or bony destruction. Moderate osteoarthritis, greatest in the lateral compartment. No acute soft tissue abnormality. IMPRESSION: Osteoarthritis, greatest in the lateral compartment. No acute findings. Electronically Signed   By: Ellery Plunk M.D.   On: 01/19/2017 03:07    Procedures Procedures (including critical care time)  Medications Ordered in ED Medications - No data to display   Initial Impression / Assessment and  Plan / ED Course  I have reviewed the triage vital signs and the nursing notes.  Pertinent labs & imaging results that were available during my care of the patient were reviewed by me and considered in my medical decision making (see chart for details).     Patient X-Ray negative for obvious fracture or dislocation.  Pt advised to follow up with orthopedics. Patient given knee sleeves bilaterally while in ED, conservative therapy recommended and discussed.   Also with dysuria.  UA and symptoms consistent with UTI.  Will give keflex for home treatment.  Patient will be discharged home & is agreeable with above plan. Returns precautions discussed. Pt appears safe for discharge.   Final Clinical Impressions(s) / ED Diagnoses   Final diagnoses:  Acute cystitis without hematuria  Acute pain of both knees  New Prescriptions New Prescriptions   No medications on file     Roxy Horseman, Cordelia Poche 01/19/17 4098    Ward, Layla Maw, DO 01/19/17 [phone]

## 2017-01-20 LAB — URINE CULTURE

## 2017-06-19 ENCOUNTER — Encounter (HOSPITAL_COMMUNITY): Payer: Self-pay | Admitting: *Deleted

## 2017-06-19 ENCOUNTER — Emergency Department (HOSPITAL_COMMUNITY)
Admission: EM | Admit: 2017-06-19 | Discharge: 2017-06-19 | Disposition: A | Discharge status: Home / Self Care | Payer: Medicaid Other | Attending: Emergency Medicine | Admitting: Emergency Medicine

## 2017-06-19 DIAGNOSIS — F17210 Nicotine dependence, cigarettes, uncomplicated: Secondary | ICD-10-CM | POA: Diagnosis not present

## 2017-06-19 DIAGNOSIS — F10120 Alcohol abuse with intoxication, uncomplicated: Secondary | ICD-10-CM | POA: Diagnosis present

## 2017-06-19 DIAGNOSIS — Z789 Other specified health status: Secondary | ICD-10-CM

## 2017-06-19 DIAGNOSIS — F10920 Alcohol use, unspecified with intoxication, uncomplicated: Secondary | ICD-10-CM | POA: Insufficient documentation

## 2017-06-19 DIAGNOSIS — Z79899 Other long term (current) drug therapy: Secondary | ICD-10-CM | POA: Diagnosis not present

## 2017-06-19 DIAGNOSIS — Z590 Homelessness: Secondary | ICD-10-CM | POA: Insufficient documentation

## 2017-06-19 DIAGNOSIS — Z7289 Other problems related to lifestyle: Secondary | ICD-10-CM

## 2017-06-19 NOTE — ED Notes (Signed)
Pt ambulated to bathroom with minimal staff stand by assist.

## 2017-06-19 NOTE — ED Triage Notes (Signed)
Patient arrives by EMS-[employer] called EMS because patient was walking out in the road barefoot. Patient told EMS that she met a man and was "going to make some money". EMS states patient incontinent of urine, admits to large amount ETOH ingestion.

## 2017-06-19 NOTE — ED Notes (Signed)
Pt resting comfortably on stretcher. Adequate rise and fall of chest noted.

## 2017-06-19 NOTE — ED Notes (Signed)
Bed: [facility] Expected date:  Expected time:  Means of arrival:  Comments: 

## 2017-06-19 NOTE — ED Provider Notes (Signed)
[facility] DEPT Provider Note   CSN: [id number] Arrival date & time: 06/19/17  0151     History   Chief Complaint Chief Complaint  Patient presents with  . Intoxicated    HPI Amy Conway is a 51 y.o. female.  The history is provided by the EMS personnel and the police.     Level V caveat: Intoxication 51 year old female with history of alcohol abuse, GERD, gout, homelessness, hypertension, presenting to the ED via EMS for acute intoxication. [employer] found patient walking in the middle of road barefoot. She reportedly told them that she met a man and was "going to make some money".  Patient does not disclose further details to me. She does admit to drinking alcohol today, does not quantify how much.  Past Medical History:  Diagnosis Date  . Alcohol abuse   . GERD (gastroesophageal reflux disease)   . Gout   . Homelessness   . Hypertension     Patient Active Problem List   Diagnosis Date Noted  . Syncope 05/24/2014  . Alcohol abuse 04/01/2014  . Viral URI 06/23/2013    Past Surgical History:  Procedure Laterality Date  . CESAREAN SECTION  1997    OB History    No data available       Home Medications    Prior to Admission medications   Medication Sig Start Date End Date Taking? Authorizing Provider  acetaminophen (TYLENOL) 500 MG tablet Take 1,000 mg by mouth every 6 (six) hours as needed for moderate pain.    [provider]  cephALEXin (KEFLEX) 500 MG capsule Take 1 capsule (500 mg total) by mouth 3 (three) times daily. 01/19/17   Montine Circle, PA-C  loperamide (IMODIUM) 2 MG capsule Take 1 capsule (2 mg total) by mouth once. Patient not taking: Reported on 02/18/2015 02/17/15   Junius Creamer, NP  metoprolol (LOPRESSOR) 50 MG tablet Take 50 mg by mouth 2 (two) times daily.    [provider]  oxyCODONE-acetaminophen (PERCOCET/ROXICET) 5-325 MG per tablet Take 1-2 tablets by mouth every 4 (four) hours as needed for moderate pain or severe  pain. Patient not taking: Reported on 02/16/2015 10/26/14   Al Corpus, PA-C  traMADol (ULTRAM) 50 MG tablet Take 1 tablet (50 mg total) by mouth every 6 (six) hours as needed. Patient not taking: Reported on 08/09/2015 02/17/15   Junius Creamer, NP    Family History No family history on file.  Social History Social History  Substance Use Topics  . Smoking status: Current Every Day Smoker    Packs/day: 1.00    Years: 29.00    Types: Cigarettes  . Smokeless tobacco: Never Used  . Alcohol use Yes     Allergies   Ibuprofen and Penicillins   Review of Systems Review of Systems  Unable to perform ROS: Other     Physical Exam Updated Vital Signs BP 118/72 (BP Location: Right Arm)   Pulse 83   Temp 97.6 F (36.4 C) (Oral)   Resp 20   LMP 10/15/2013   SpO2 96%   Physical Exam  Constitutional: She is oriented to person, place, and time. She appears well-developed and well-nourished.  Sleeping on stretcher, NAD  HENT:  Head: Normocephalic and atraumatic.  Mouth/Throat: Oropharynx is clear and moist.  Eyes: Pupils are equal, round, and reactive to light. Conjunctivae and EOM are normal.  Neck: Normal range of motion.  Cardiovascular: Normal rate, regular rhythm and normal heart sounds.   Pulmonary/Chest: Effort normal and  breath sounds normal.  Abdominal: Soft. Bowel sounds are normal.  Musculoskeletal: Normal range of motion.  Neurological: She is alert and oriented to person, place, and time.  Skin: Skin is warm and dry.  Psychiatric: She has a normal mood and affect.  Nursing note and vitals reviewed.    ED Treatments / Results  Labs (all labs ordered are listed, but only abnormal results are displayed) Labs Reviewed - No data to display  EKG  EKG Interpretation None       Radiology No results found.  Procedures Procedures (including critical care time)  Medications Ordered in ED Medications - No data to display   Initial Impression /  Assessment and Plan / ED Course  I have reviewed the triage vital signs and the nursing notes.  Pertinent labs & imaging results that were available during my care of the patient were reviewed by me and considered in my medical decision making (see chart for details).  51 year old female here acutely intoxicated. She was found walking in the road barefoot. She is awake and alert here but does appear clinically intoxicated. No apparent injuries.  She is ambulatory but gait is not steady. She will be allowed to sober. Vitals stable.    Anticipate discharge once clinically sober.  Final Clinical Impressions(s) / ED Diagnoses   Final diagnoses:  Alcohol use    New Prescriptions New Prescriptions   No medications on file      Larene Pickett, PA-C 06/19/17 [place], [place], MD 06/20/17 [phone]

## 2017-06-19 NOTE — ED Notes (Signed)
Pt noted to be sleeping comfortably on stretcher. Adequate rise and fall of chest noted.

## 2017-06-19 NOTE — ED Triage Notes (Signed)
BP 144/117 CBG 77 HR 82

## 2018-09-26 ENCOUNTER — Other Ambulatory Visit: Payer: Self-pay

## 2018-09-26 ENCOUNTER — Encounter (HOSPITAL_COMMUNITY): Payer: Self-pay

## 2018-09-26 ENCOUNTER — Emergency Department (HOSPITAL_COMMUNITY)
Admission: EM | Admit: 2018-09-26 | Discharge: 2018-09-26 | Disposition: A | Discharge status: Home / Self Care | Payer: Medicaid Other | Attending: Emergency Medicine | Admitting: Emergency Medicine

## 2018-09-26 ENCOUNTER — Emergency Department (HOSPITAL_COMMUNITY): Payer: Medicaid Other

## 2018-09-26 DIAGNOSIS — R062 Wheezing: Secondary | ICD-10-CM | POA: Diagnosis present

## 2018-09-26 DIAGNOSIS — J209 Acute bronchitis, unspecified: Secondary | ICD-10-CM | POA: Diagnosis not present

## 2018-09-26 MED ORDER — PREDNISONE 10 MG (21) PO TBPK: ORAL_TABLET | Freq: Every day | ORAL | 0 refills | Status: DC

## 2018-09-26 MED ORDER — ALBUTEROL SULFATE HFA 108 (90 BASE) MCG/ACT IN AERS: 1.0000 | INHALATION_SPRAY | Freq: Four times a day (QID) | RESPIRATORY_TRACT | 0 refills | Status: DC | PRN

## 2018-09-26 MED ORDER — PREDNISONE 20 MG PO TABS
60.0000 mg | ORAL_TABLET | Freq: Once | ORAL | Status: AC
  Administered 2018-09-26: 60 mg via ORAL
  Filled 2018-09-26: qty 3

## 2018-09-26 MED ORDER — IPRATROPIUM-ALBUTEROL 0.5-2.5 (3) MG/3ML IN SOLN
3.0000 mL | Freq: Once | RESPIRATORY_TRACT | Status: AC
Start: 2018-09-26 — End: 2018-09-26
  Administered 2018-09-26: 3 mL via RESPIRATORY_TRACT
  Filled 2018-09-26: qty 3

## 2018-09-26 MED ORDER — DOXYCYCLINE HYCLATE 100 MG PO CAPS: 100.0000 mg | ORAL_CAPSULE | Freq: Two times a day (BID) | ORAL | 0 refills | Status: DC

## 2018-09-26 NOTE — ED Notes (Signed)
Patient verbalizes understanding of discharge instructions. Opportunity for questioning and answers were provided. Armband removed by staff, pt discharged from ED ambulatory.   

## 2018-09-26 NOTE — ED Provider Notes (Signed)
[facility]   CSN: [id number] Arrival date & time: 09/26/18  1317     History   Chief Complaint Chief Complaint  Patient presents with  . Wheezing    HPI Amy BuntingMichell C Conway is a 53 y.o. female.  She is complaining of 1 week of cough and chest congestion with wheezing.  Cough is productive of some green sputum.  No known fever.  She said she had a similar thing in November and they treated her with antibiotics and steroid taper with an inhaler.  She is been using the inhaler and it has not seemed to provide any relief this time.  She is smoking.  Has had some posttussive vomiting.  The history is provided by the patient.  Wheezing  Severity:  Moderate Severity compared to prior episodes:  Similar Onset quality:  Gradual Duration:  1 week Timing:  Constant Progression:  Unchanged Chronicity:  Recurrent Relieved by:  Nothing Worsened by:  Nothing Ineffective treatments:  Beta-agonist inhaler Associated symptoms: chest tightness, cough, rhinorrhea, shortness of breath, sore throat and sputum production   Associated symptoms: no chest pain, no fever, no headaches, no rash and no stridor     Past Medical History:  Diagnosis Date  . Alcohol abuse   . GERD (gastroesophageal reflux disease)   . Gout   . Homelessness   . Hypertension     Patient Active Problem List   Diagnosis Date Noted  . Syncope 05/24/2014  . Alcohol abuse 04/01/2014  . Viral URI 06/23/2013    Past Surgical History:  Procedure Laterality Date  . CESAREAN SECTION  1997     OB History   No obstetric history on file.      Home Medications    Prior to Admission medications   Medication Sig Start Date End Date Taking? Authorizing Provider  acetaminophen (TYLENOL) 500 MG tablet Take 1,000 mg by mouth every 6 (six) hours as needed for moderate pain.    [provider]  cephALEXin (KEFLEX) 500 MG capsule Take 1 capsule (500 mg total) by  mouth 3 (three) times daily. 01/19/17   Roxy HorsemanBrowning, Robert, PA-C  loperamide (IMODIUM) 2 MG capsule Take 1 capsule (2 mg total) by mouth once. Patient not taking: Reported on 02/18/2015 02/17/15   Earley FavorSchulz, Gail, NP  metoprolol (LOPRESSOR) 50 MG tablet Take 50 mg by mouth 2 (two) times daily.    [provider]  oxyCODONE-acetaminophen (PERCOCET/ROXICET) 5-325 MG per tablet Take 1-2 tablets by mouth every 4 (four) hours as needed for moderate pain or severe pain. Patient not taking: Reported on 02/16/2015 10/26/14   Oswaldo Conroyreech, Victoria, PA-C  traMADol (ULTRAM) 50 MG tablet Take 1 tablet (50 mg total) by mouth every 6 (six) hours as needed. Patient not taking: Reported on 08/09/2015 02/17/15   Earley FavorSchulz, Gail, NP    Family History No family history on file.  Social History Social History   Tobacco Use  . Smoking status: Current Every Day Smoker    Packs/day: 1.00    Years: 29.00    Pack years: 29.00    Types: Cigarettes  . Smokeless tobacco: Never Used  Substance Use Topics  . Alcohol use: Yes  . Drug use: No     Allergies   Ibuprofen and Penicillins   Review of Systems Review of Systems  Constitutional: Negative for fever.  HENT: Positive for rhinorrhea and sore throat.   Eyes: Negative for visual disturbance.  Respiratory: Positive for cough, sputum production,  chest tightness, shortness of breath and wheezing. Negative for stridor.   Cardiovascular: Negative for chest pain.  Gastrointestinal: Positive for vomiting. Negative for abdominal pain and nausea.  Genitourinary: Negative for dysuria.  Musculoskeletal: Negative for neck pain.  Skin: Negative for rash.  Neurological: Negative for headaches.     Physical Exam Updated Vital Signs BP (!) 158/102 (BP Location: Right Arm)   Pulse 80   Temp 98.2 F (36.8 C) (Oral)   Resp 20   Ht 5\' 1"  (1.549 m)   Wt 79.4 kg   LMP 10/15/2013   SpO2 99%   BMI 33.07 kg/m   Physical Exam Vitals signs and nursing note reviewed.    Constitutional:      General: She is not in acute distress.    Appearance: She is well-developed.  HENT:     Head: Normocephalic and atraumatic.  Eyes:     Conjunctiva/sclera: Conjunctivae normal.  Neck:     Musculoskeletal: Neck supple.  Cardiovascular:     Rate and Rhythm: Normal rate and regular rhythm.     Heart sounds: No murmur.  Pulmonary:     Effort: Pulmonary effort is normal. No respiratory distress.     Breath sounds: Wheezing present.  Chest:     Chest wall: Tenderness present.  Abdominal:     Palpations: Abdomen is soft.     Tenderness: There is no abdominal tenderness.  Musculoskeletal: Normal range of motion.     Right lower leg: No edema.     Left lower leg: No edema.  Skin:    General: Skin is warm and dry.     Capillary Refill: Capillary refill takes less than 2 seconds.  Neurological:     General: No focal deficit present.     Mental Status: She is alert.     Gait: Gait normal.      ED Treatments / Results  Labs (all labs ordered are listed, but only abnormal results are displayed) Labs Reviewed - No data to display  EKG None  Radiology Dg Chest 2 View  Result Date: 09/26/2018 CLINICAL DATA:  Productive cough. EXAM: CHEST - 2 VIEW COMPARISON:  07/25/2014 FINDINGS: The heart size and mediastinal contours are within normal limits. Both lungs are clear. The visualized skeletal structures are unremarkable. IMPRESSION: No active cardiopulmonary disease. Electronically Signed   By: Myles Rosenthal M.D.   On: 09/26/2018 14:39    Procedures Procedures (including critical care time)  Medications Ordered in ED Medications  ipratropium-albuterol (DUONEB) 0.5-2.5 (3) MG/3ML nebulizer solution 3 mL (has no administration in time range)  predniSONE (DELTASONE) tablet 60 mg (has no administration in time range)     Initial Impression / Assessment and Plan / ED Course  I have reviewed the triage vital signs and the nursing notes.  Pertinent labs & imaging  results that were available during my care of the patient were reviewed by me and considered in my medical decision making (see chart for details).   Patient feels improved after breathing treatment.  Will start on some steroids and antibiotics for COPD/bronchitis.   Final Clinical Impressions(s) / ED Diagnoses   Final diagnoses:  Acute bronchitis, unspecified organism    ED Discharge Orders         Ordered    albuterol (PROVENTIL HFA;VENTOLIN HFA) 108 (90 Base) MCG/ACT inhaler  Every 6 hours PRN     09/26/18 1445    predniSONE (STERAPRED UNI-PAK 21 TAB) 10 MG (21) TBPK tablet  Daily  09/26/18 1445    doxycycline (VIBRAMYCIN) 100 MG capsule  2 times daily     09/26/18 1445           Terrilee Files, MD 09/26/18 1650

## 2018-09-26 NOTE — ED Triage Notes (Signed)
Pt arrives to ED with complaints of wheezing and congestion for several weeks. Patient has been using a home inhaler and nasal spray with some relief.

## 2018-09-26 NOTE — Discharge Instructions (Signed)
You were evaluated in the emergency department for cough, wheezing, shortness of breath.  Your chest x-ray did not show any obvious pneumonia.  We are prescribing some antibiotics and a prednisone taper along with another inhaler.  Please consider stopping smoking.  Stay well-hydrated.

## 2018-10-28 ENCOUNTER — Emergency Department (HOSPITAL_COMMUNITY): Payer: Medicaid Other

## 2018-10-28 ENCOUNTER — Emergency Department (HOSPITAL_COMMUNITY)
Admission: EM | Admit: 2018-10-28 | Discharge: 2018-10-29 | Disposition: A | Discharge status: Home / Self Care | Payer: Medicaid Other | Attending: Emergency Medicine | Admitting: Emergency Medicine

## 2018-10-28 DIAGNOSIS — R05 Cough: Secondary | ICD-10-CM | POA: Insufficient documentation

## 2018-10-28 DIAGNOSIS — Z5321 Procedure and treatment not carried out due to patient leaving prior to being seen by health care provider: Secondary | ICD-10-CM | POA: Insufficient documentation

## 2018-10-28 NOTE — ED Notes (Signed)
Pt wants to leave. Pt encouraged to stay. Pt refuses and gives back labels. Pt seen leaving lobby.

## 2018-10-28 NOTE — ED Triage Notes (Signed)
Pt reports cough X1 mo and vaginal bleeding. Poor historian.

## 2018-12-03 ENCOUNTER — Other Ambulatory Visit: Payer: Self-pay

## 2018-12-03 ENCOUNTER — Emergency Department (HOSPITAL_COMMUNITY)
Admission: EM | Admit: 2018-12-03 | Discharge: 2018-12-04 | Disposition: A | Discharge status: Home / Self Care | Payer: Medicaid Other | Attending: Emergency Medicine | Admitting: Emergency Medicine

## 2018-12-03 ENCOUNTER — Encounter (HOSPITAL_COMMUNITY): Payer: Self-pay | Admitting: *Deleted

## 2018-12-03 DIAGNOSIS — R109 Unspecified abdominal pain: Secondary | ICD-10-CM

## 2018-12-03 DIAGNOSIS — I10 Essential (primary) hypertension: Secondary | ICD-10-CM | POA: Insufficient documentation

## 2018-12-03 DIAGNOSIS — F17210 Nicotine dependence, cigarettes, uncomplicated: Secondary | ICD-10-CM | POA: Diagnosis not present

## 2018-12-03 DIAGNOSIS — Z79899 Other long term (current) drug therapy: Secondary | ICD-10-CM | POA: Diagnosis not present

## 2018-12-03 DIAGNOSIS — R1030 Lower abdominal pain, unspecified: Secondary | ICD-10-CM | POA: Insufficient documentation

## 2018-12-03 DIAGNOSIS — G8929 Other chronic pain: Secondary | ICD-10-CM | POA: Insufficient documentation

## 2018-12-03 LAB — COMPREHENSIVE METABOLIC PANEL
ALBUMIN: 4 g/dL (ref 3.5–5.0)
ALK PHOS: 94 U/L (ref 38–126)
ALT: 17 U/L (ref 0–44)
AST: 23 U/L (ref 15–41)
Anion gap: 9 (ref 5–15)
BUN: 17 mg/dL (ref 6–20)
CO2: 26 mmol/L (ref 22–32)
CREATININE: 0.91 mg/dL (ref 0.44–1.00)
Calcium: 8.7 mg/dL — ABNORMAL LOW (ref 8.9–10.3)
Chloride: 106 mmol/L (ref 98–111)
GFR calc Af Amer: >60 mL/min (ref >60)
GFR calc non Af Amer: >60 mL/min (ref >60)
GLUCOSE: 96 mg/dL (ref 70–99)
POTASSIUM: 4.2 mmol/L (ref 3.5–5.1)
Sodium: 141 mmol/L (ref 135–145)
Total Bilirubin: 0.4 mg/dL (ref 0.3–1.2)
Total Protein: 7.2 g/dL (ref 6.5–8.1)

## 2018-12-03 LAB — I-STAT BETA HCG BLOOD, ED (MC, WL, AP ONLY)

## 2018-12-03 LAB — URINALYSIS, ROUTINE W REFLEX MICROSCOPIC
Bilirubin Urine: NEGATIVE
Glucose, UA: NEGATIVE mg/dL
Hgb urine dipstick: NEGATIVE
Ketones, ur: NEGATIVE mg/dL
Nitrite: NEGATIVE
PROTEIN: NEGATIVE mg/dL
Specific Gravity, Urine: 1.006 (ref 1.005–1.030)
pH: 5 (ref 5.0–8.0)

## 2018-12-03 LAB — CBC
HCT: 43.8 % (ref 36.0–46.0)
HEMOGLOBIN: 13.5 g/dL (ref 12.0–15.0)
MCH: 23.8 pg — AB (ref 26.0–34.0)
MCHC: 30.8 g/dL (ref 30.0–36.0)
MCV: 77.2 fL — ABNORMAL LOW (ref 80.0–100.0)
Platelets: 209 10*3/uL (ref 150–400)
RBC: 5.67 MIL/uL — ABNORMAL HIGH (ref 3.87–5.11)
RDW: 15.3 % (ref 11.5–15.5)
WBC: 6.3 10*3/uL (ref 4.0–10.5)
nRBC: 0 % (ref 0.0–0.2)

## 2018-12-03 LAB — LIPASE, BLOOD: Lipase: 34 U/L (ref 11–51)

## 2018-12-03 MED ORDER — SODIUM CHLORIDE 0.9% FLUSH: 3.0000 mL | Freq: Once | INTRAVENOUS | Status: DC

## 2018-12-03 NOTE — ED Triage Notes (Signed)
Per [facility] pt c/o abd pain & vaginal bleeding.x 2 days ago.  ETOH on board.  Multiple complaints.

## 2018-12-03 NOTE — ED Provider Notes (Signed)
TIME SEEN: 11:52 PM  CHIEF COMPLAINT: Abdominal pain  HPI: Patient is a 53 year old female with history of alcohol abuse, homelessness, hypertension who presents to the emergency department with EMS intoxicated complaining of diffuse abdominal pain worse in the lower abdomen for the past year.  States she has had vomiting and diarrhea.  No fevers.  No dysuria or hematuria.  States when she wipes she notices a brown discharge.  She is not concerned for STDs at this time.  ROS: See HPI Constitutional: no fever  Eyes: no drainage  ENT: no runny nose   Cardiovascular:  no chest pain  Resp: no SOB  GI: Vomiting and diarrhea GU: no dysuria Integumentary: no rash  Allergy: no hives  Musculoskeletal: no leg swelling  Neurological: no slurred speech ROS otherwise negative  PAST MEDICAL HISTORY/PAST SURGICAL HISTORY:  Past Medical History:  Diagnosis Date  . Alcohol abuse   . GERD (gastroesophageal reflux disease)   . Gout   . Homelessness   . Hypertension     MEDICATIONS:  Prior to Admission medications   Medication Sig Start Date End Date Taking? Authorizing Provider  acetaminophen (TYLENOL) 500 MG tablet Take 1,000 mg by mouth every 6 (six) hours as needed for moderate pain.    [provider]  albuterol (PROVENTIL HFA;VENTOLIN HFA) 108 (90 Base) MCG/ACT inhaler Inhale 1-2 puffs into the lungs every 6 (six) hours as needed for wheezing or shortness of breath. 09/26/18   Terrilee Files, MD  cephALEXin (KEFLEX) 500 MG capsule Take 1 capsule (500 mg total) by mouth 3 (three) times daily. 01/19/17   Roxy Horseman, PA-C  doxycycline (VIBRAMYCIN) 100 MG capsule Take 1 capsule (100 mg total) by mouth 2 (two) times daily. 09/26/18   Terrilee Files, MD  loperamide (IMODIUM) 2 MG capsule Take 1 capsule (2 mg total) by mouth once. Patient not taking: Reported on 02/18/2015 02/17/15   Earley Favor, NP  metoprolol (LOPRESSOR) 50 MG tablet Take 50 mg by mouth 2 (two) times daily.     [provider]  oxyCODONE-acetaminophen (PERCOCET/ROXICET) 5-325 MG per tablet Take 1-2 tablets by mouth every 4 (four) hours as needed for moderate pain or severe pain. Patient not taking: Reported on 02/16/2015 10/26/14   Oswaldo Conroy, PA-C  predniSONE (STERAPRED UNI-PAK 21 TAB) 10 MG (21) TBPK tablet Take by mouth daily. Take 6 tabs by mouth daily  for 2 days, then decrease by 1 tablet every 2 days 09/26/18   Terrilee Files, MD  traMADol (ULTRAM) 50 MG tablet Take 1 tablet (50 mg total) by mouth every 6 (six) hours as needed. Patient not taking: Reported on 08/09/2015 02/17/15   Earley Favor, NP    ALLERGIES:  Allergies  Allergen Reactions  . Ibuprofen Hives and Itching  . Penicillins Hives and Itching    Has patient had a PCN reaction causing immediate rash, facial/tongue/throat swelling, SOB or lightheadedness with hypotension: {Yes Has patient had a PCN reaction causing severe rash involving mucus membranes or skin necrosis: No Has patient had a PCN reaction that required hospitalization {Yes Has patient had a PCN reaction occurring within the last 10 years: {Yes If all of the above answers are "NO", then may proceed with Cephalosporin use.    SOCIAL HISTORY:  Social History   Tobacco Use  . Smoking status: Current Every Day Smoker    Packs/day: 0.50    Years: 29.00    Pack years: 14.50    Types: Cigarettes  . Smokeless tobacco:  Never Used  Substance Use Topics  . Alcohol use: Yes    FAMILY HISTORY: No family history on file.  EXAM: BP (!) 150/101 (BP Location: Right Arm)   Pulse 72   Temp 97.9 F (36.6 C) (Oral)   Resp 20   Ht 5\' 1"  (1.549 m)   Wt 81.6 kg   LMP 10/15/2013   SpO2 100%   BMI 34.01 kg/m  CONSTITUTIONAL: Alert and oriented and responds appropriately to questions. Well-appearing; well-nourished HEAD: Normocephalic EYES: Conjunctivae clear, pupils appear equal, EOMI ENT: normal nose; moist mucous membranes NECK: Supple, no meningismus,  no nuchal rigidity, no LAD  CARD: RRR; S1 and S2 appreciated; no murmurs, no clicks, no rubs, no gallops RESP: Normal chest excursion without splinting or tachypnea; breath sounds clear and equal bilaterally; no wheezes, no rhonchi, no rales, no hypoxia or respiratory distress, speaking full sentences ABD/GI: Normal bowel sounds; non-distended; soft, mildly tender to palpation diffusely, no rebound, no guarding, no peritoneal signs, no hepatosplenomegaly BACK:  The back appears normal and is non-tender to palpation, there is no CVA tenderness EXT: Normal ROM in all joints; non-tender to palpation; no edema; normal capillary refill; no cyanosis, no calf tenderness or swelling    SKIN: Normal color for age and race; warm; no rash NEURO: Moves all extremities equally PSYCH: The patient's mood and manner are appropriate. Grooming and personal hygiene are appropriate.  MEDICAL DECISION MAKING: Patient here with complaints of diffuse abdominal pain for the past year.  Suspect this is related to her alcohol abuse.  Appears slightly intoxicated today and smells of alcohol.  She is mildly tender throughout her abdomen but points to her pelvic region as the source of most of her pain reports she has noted some brown discharge intermittently.  Last menstrual period was in 2015.  Labs obtained in triage are unremarkable.  Normal hemoglobin, LFTs, lipase, creatinine.  Pregnancy test is negative.  Will perform pelvic exam with cultures.  Will give Tylenol, Zofran for symptomatic relief.  ED PROGRESS: 12:08 AM  Informed by nursing staff that patient did not want to proceed with pelvic exam and refused to take off her pants.  She decided to leave the emergency department AGAINST MEDICAL ADVICE as discussed with RN.  I did not get to talk to her before leaving or provide discharge paperwork.  I reviewed all nursing notes, vitals, pertinent previous records, EKGs, lab and urine results, imaging (as available).     Ponciano Shealy, Layla Maw, DO 12/04/18 0009

## 2018-12-04 MED ORDER — ACETAMINOPHEN 500 MG PO TABS: 1000.0000 mg | ORAL_TABLET | Freq: Once | ORAL | Status: DC

## 2018-12-04 MED ORDER — ONDANSETRON 4 MG PO TBDP: 4.0000 mg | ORAL_TABLET | Freq: Once | ORAL | Status: DC

## 2018-12-04 NOTE — ED Notes (Signed)
Pt left after initial examination by Dr Elesa Massed

## 2018-12-04 NOTE — ED Notes (Signed)
Amy Conway was asked to change into hospital gown to have pelvic examination by Dr Elesa Massed. Amy Conway was observed leaving room and when asked where she was going she stated she was going home because she didn't feel well. I reminded her that a  doctor was coming to see her in a few minutes she stated " I don't want no pelvic exam". I asked her if anyone had done anything that made her upset she said " No everybody has been ok, I just want to go home". Amy Conway sign AMA papers and left the department.

## 2019-07-31 ENCOUNTER — Emergency Department (HOSPITAL_COMMUNITY)
Admission: EM | Admit: 2019-07-31 | Discharge: 2019-07-31 | Disposition: A | Discharge status: Home / Self Care | Payer: Medicaid Other | Attending: Emergency Medicine | Admitting: Emergency Medicine

## 2019-07-31 ENCOUNTER — Emergency Department (HOSPITAL_COMMUNITY): Payer: Medicaid Other

## 2019-07-31 ENCOUNTER — Other Ambulatory Visit: Payer: Self-pay

## 2019-07-31 ENCOUNTER — Encounter (HOSPITAL_COMMUNITY): Payer: Self-pay | Admitting: Emergency Medicine

## 2019-07-31 DIAGNOSIS — F17210 Nicotine dependence, cigarettes, uncomplicated: Secondary | ICD-10-CM | POA: Insufficient documentation

## 2019-07-31 DIAGNOSIS — Z79899 Other long term (current) drug therapy: Secondary | ICD-10-CM | POA: Diagnosis not present

## 2019-07-31 DIAGNOSIS — K5792 Diverticulitis of intestine, part unspecified, without perforation or abscess without bleeding: Secondary | ICD-10-CM

## 2019-07-31 DIAGNOSIS — K5730 Diverticulosis of large intestine without perforation or abscess without bleeding: Secondary | ICD-10-CM | POA: Insufficient documentation

## 2019-07-31 DIAGNOSIS — R1084 Generalized abdominal pain: Secondary | ICD-10-CM | POA: Diagnosis present

## 2019-07-31 LAB — URINALYSIS, ROUTINE W REFLEX MICROSCOPIC
Bilirubin Urine: NEGATIVE
Glucose, UA: NEGATIVE mg/dL
Ketones, ur: NEGATIVE mg/dL
Nitrite: NEGATIVE
Protein, ur: NEGATIVE mg/dL
Specific Gravity, Urine: 1.017 (ref 1.005–1.030)
pH: 5 (ref 5.0–8.0)

## 2019-07-31 LAB — COMPREHENSIVE METABOLIC PANEL
ALT: 27 U/L (ref 0–44)
AST: 37 U/L (ref 15–41)
Albumin: 3.9 g/dL (ref 3.5–5.0)
Alkaline Phosphatase: 101 U/L (ref 38–126)
Anion gap: 13 (ref 5–15)
BUN: 7 mg/dL (ref 6–20)
CO2: 26 mmol/L (ref 22–32)
Calcium: 9.5 mg/dL (ref 8.9–10.3)
Chloride: 96 mmol/L — ABNORMAL LOW (ref 98–111)
Creatinine, Ser: 0.8 mg/dL (ref 0.44–1.00)
GFR calc Af Amer: >60 mL/min (ref >60)
GFR calc non Af Amer: >60 mL/min (ref >60)
Glucose, Bld: 103 mg/dL — ABNORMAL HIGH (ref 70–99)
Potassium: 3.9 mmol/L (ref 3.5–5.1)
Sodium: 135 mmol/L (ref 135–145)
Total Bilirubin: 0.8 mg/dL (ref 0.3–1.2)
Total Protein: 7.6 g/dL (ref 6.5–8.1)

## 2019-07-31 LAB — CBC WITH DIFFERENTIAL/PLATELET
Abs Immature Granulocytes: 0.04 10*3/uL (ref 0.00–0.07)
Basophils Absolute: 0 10*3/uL (ref 0.0–0.1)
Basophils Relative: 0 %
Eosinophils Absolute: 0 10*3/uL (ref 0.0–0.5)
Eosinophils Relative: 0 %
HCT: 45.9 % (ref 36.0–46.0)
Hemoglobin: 14.3 g/dL (ref 12.0–15.0)
Immature Granulocytes: 0 %
Lymphocytes Relative: 10 %
Lymphs Abs: 1.1 10*3/uL (ref 0.7–4.0)
MCH: 24.1 pg — ABNORMAL LOW (ref 26.0–34.0)
MCHC: 31.2 g/dL (ref 30.0–36.0)
MCV: 77.4 fL — ABNORMAL LOW (ref 80.0–100.0)
Monocytes Absolute: 0.7 10*3/uL (ref 0.1–1.0)
Monocytes Relative: 7 %
Neutro Abs: 9.4 10*3/uL — ABNORMAL HIGH (ref 1.7–7.7)
Neutrophils Relative %: 83 %
Platelets: 271 10*3/uL (ref 150–400)
RBC: 5.93 MIL/uL — ABNORMAL HIGH (ref 3.87–5.11)
RDW: 14.6 % (ref 11.5–15.5)
WBC: 11.3 10*3/uL — ABNORMAL HIGH (ref 4.0–10.5)
nRBC: 0 % (ref 0.0–0.2)

## 2019-07-31 LAB — LIPASE, BLOOD: Lipase: 21 U/L (ref 11–51)

## 2019-07-31 LAB — I-STAT BETA HCG BLOOD, ED (MC, WL, AP ONLY): I-stat hCG, quantitative: <5 m[IU]/mL (ref <5)

## 2019-07-31 MED ORDER — CIPROFLOXACIN HCL 500 MG PO TABS: 500.0000 mg | ORAL_TABLET | Freq: Two times a day (BID) | ORAL | 0 refills | Status: AC

## 2019-07-31 MED ORDER — METRONIDAZOLE 500 MG PO TABS
500.0000 mg | ORAL_TABLET | Freq: Once | ORAL | Status: AC
  Administered 2019-07-31: 500 mg via ORAL
  Filled 2019-07-31: qty 1

## 2019-07-31 MED ORDER — OXYCODONE-ACETAMINOPHEN 5-325 MG PO TABS
1.0000 | ORAL_TABLET | Freq: Once | ORAL | Status: AC
  Administered 2019-07-31: 09:00:00 1 via ORAL
  Filled 2019-07-31: qty 1

## 2019-07-31 MED ORDER — METRONIDAZOLE 500 MG PO TABS: 500.0000 mg | ORAL_TABLET | Freq: Three times a day (TID) | ORAL | 0 refills | Status: AC

## 2019-07-31 MED ORDER — CIPROFLOXACIN HCL 500 MG PO TABS
500.0000 mg | ORAL_TABLET | Freq: Once | ORAL | Status: AC
  Administered 2019-07-31: 500 mg via ORAL
  Filled 2019-07-31: qty 1

## 2019-07-31 MED ORDER — METOPROLOL TARTRATE 25 MG PO TABS
50.0000 mg | ORAL_TABLET | Freq: Once | ORAL | Status: AC
  Administered 2019-07-31: 09:00:00 50 mg via ORAL
  Filled 2019-07-31: qty 2

## 2019-07-31 MED ORDER — IOHEXOL 300 MG/ML  SOLN
100.0000 mL | Freq: Once | INTRAMUSCULAR | Status: AC | PRN
  Administered 2019-07-31: 100 mL via INTRAVENOUS

## 2019-07-31 NOTE — ED Triage Notes (Signed)
Patient here with abdominal pain, radiating down her right side.  Patient having urinary frequency, hesitancy.  No nausea or vomiting at this time.

## 2019-07-31 NOTE — ED Provider Notes (Signed)
[facility] EMERGENCY DEPARTMENT Provider Note   CSN: [id number] Arrival date & time: 07/31/19  4975     History   Chief Complaint Chief Complaint  Patient presents with   Abdominal Pain   Flank Pain    HPI Amy Conway is a 53 y.o. female.      Abdominal Pain Pain location:  Generalized Pain quality: cramping   Pain radiates to:  Does not radiate Pain severity:  Mild Onset quality:  Gradual Duration:  8 hours Timing:  Constant Progression:  Worsening Chronicity:  New (Although patient states that she has had similar pains previously) Context: alcohol use   Context: not recent travel and not trauma   Relieved by: Urination as well as bowel movements. Worsened by:  Movement Ineffective treatments:  None tried Associated symptoms: diarrhea (For the past 2 days), dysuria and fatigue   Associated symptoms: no anorexia, no belching, no chest pain, no chills, no constipation, no cough, no fever, no flatus, no hematemesis, no hematochezia, no hematuria, no melena, no nausea, no shortness of breath, no sore throat, no vaginal bleeding, no vaginal discharge and no vomiting   Risk factors: alcohol abuse   Flank Pain Associated symptoms include abdominal pain. Pertinent negatives include no chest pain and no shortness of breath.    Past Medical History:  Diagnosis Date   Alcohol abuse    GERD (gastroesophageal reflux disease)    Gout    Homelessness    Hypertension     Patient Active Problem List   Diagnosis Date Noted   Syncope 05/24/2014   Alcohol abuse 04/01/2014   Viral URI 06/23/2013    Past Surgical History:  Procedure Laterality Date   CESAREAN SECTION  1997     OB History   No obstetric history on file.      Home Medications    Prior to Admission medications   Medication Sig Start Date End Date Taking? Authorizing Provider  albuterol (PROVENTIL HFA;VENTOLIN HFA) 108 (90 Base) MCG/ACT inhaler Inhale 1-2 puffs  into the lungs every 6 (six) hours as needed for wheezing or shortness of breath. 09/26/18   Terrilee Files, MD  Aspirin-Acetaminophen-Caffeine (GOODYS EXTRA STRENGTH) [phone] MG PACK Take 1 Package by mouth every 6 (six) hours as needed (pain).    [provider]  cephALEXin (KEFLEX) 500 MG capsule Take 1 capsule (500 mg total) by mouth 3 (three) times daily. Patient not taking: Reported on 12/03/2018 01/19/17   Roxy Horseman, PA-C  ciprofloxacin (CIPRO) 500 MG tablet Take 1 tablet (500 mg total) by mouth every 12 (twelve) hours for 10 days. 07/31/19 08/10/19  Jonna Clark, MD  doxycycline (VIBRAMYCIN) 100 MG capsule Take 1 capsule (100 mg total) by mouth 2 (two) times daily. Patient not taking: Reported on 12/03/2018 09/26/18   Terrilee Files, MD  hydroxypropyl methylcellulose / hypromellose (ISOPTO TEARS / GONIOVISC) 2.5 % ophthalmic solution Place 1 drop into both eyes 3 (three) times daily as needed for dry eyes.    [provider]  loperamide (IMODIUM) 2 MG capsule Take 1 capsule (2 mg total) by mouth once. Patient not taking: Reported on 02/18/2015 02/17/15   Earley Favor, NP  metoprolol (LOPRESSOR) 50 MG tablet Take 50 mg by mouth 2 (two) times daily.    [provider]  metroNIDAZOLE (FLAGYL) 500 MG tablet Take 1 tablet (500 mg total) by mouth 3 (three) times daily for 10 days. 07/31/19 08/10/19  Jonna Clark, MD  oxyCODONE-acetaminophen (PERCOCET/ROXICET) 5-325 MG  per tablet Take 1-2 tablets by mouth every 4 (four) hours as needed for moderate pain or severe pain. Patient not taking: Reported on 02/16/2015 10/26/14   Al Corpus, PA-C  predniSONE (STERAPRED UNI-PAK 21 TAB) 10 MG (21) TBPK tablet Take by mouth daily. Take 6 tabs by mouth daily  for 2 days, then decrease by 1 tablet every 2 days Patient not taking: Reported on 12/03/2018 09/26/18   Hayden Rasmussen, MD  traMADol (ULTRAM) 50 MG tablet Take 1 tablet (50 mg total) by mouth every 6 (six)  hours as needed. Patient not taking: Reported on 08/09/2015 02/17/15   Junius Creamer, NP    Family History No family history on file.  Social History Social History   Tobacco Use   Smoking status: Current Every Day Smoker    Packs/day: 0.50    Years: 29.00    Pack years: 14.50    Types: Cigarettes   Smokeless tobacco: Never Used  Substance Use Topics   Alcohol use: Yes   Drug use: No     Allergies   Ibuprofen and Penicillins   Review of Systems Review of Systems  Constitutional: Positive for fatigue. Negative for chills and fever.  HENT: Negative for ear pain and sore throat.   Eyes: Negative for pain and visual disturbance.  Respiratory: Negative for cough and shortness of breath.   Cardiovascular: Negative for chest pain and palpitations.  Gastrointestinal: Positive for abdominal pain and diarrhea (For the past 2 days). Negative for anal bleeding, anorexia, blood in stool, constipation, flatus, hematemesis, hematochezia, melena, nausea, rectal pain and vomiting.  Genitourinary: Positive for dysuria, flank pain and frequency. Negative for decreased urine volume, hematuria, pelvic pain, vaginal bleeding, vaginal discharge and vaginal pain.  Musculoskeletal: Negative for arthralgias and back pain.  Skin: Negative for color change and rash.  Neurological: Negative for seizures and syncope.  All other systems reviewed and are negative.    Physical Exam Updated Vital Signs BP (!) 152/83    Pulse 72    Temp 98.6 F (37 C) (Oral)    Resp 18    LMP 10/15/2013    SpO2 96%   Physical Exam Vitals signs and nursing note reviewed.  Constitutional:      General: She is not in acute distress.    Appearance: She is well-developed. She is obese. She is not ill-appearing.  HENT:     Head: Normocephalic and atraumatic.  Eyes:     Conjunctiva/sclera: Conjunctivae normal.  Neck:     Musculoskeletal: Neck supple.  Cardiovascular:     Rate and Rhythm: Normal rate and regular  rhythm.     Heart sounds: No murmur.  Pulmonary:     Effort: Pulmonary effort is normal. No respiratory distress.     Breath sounds: Normal breath sounds.  Abdominal:     Palpations: Abdomen is soft.     Tenderness: There is generalized abdominal tenderness (Predominance and left hemiabdomen).  Skin:    General: Skin is warm and dry.  Neurological:     Mental Status: She is alert.      ED Treatments / Results  Labs (all labs ordered are listed, but only abnormal results are displayed) Labs Reviewed  COMPREHENSIVE METABOLIC PANEL - Abnormal; Notable for the following components:      Result Value   Chloride 96 (*)    Glucose, Bld 103 (*)    All other components within normal limits  CBC WITH DIFFERENTIAL/PLATELET - Abnormal; Notable for the following components:  WBC 11.3 (*)    RBC 5.93 (*)    MCV 77.4 (*)    MCH 24.1 (*)    Neutro Abs 9.4 (*)    All other components within normal limits  URINALYSIS, ROUTINE W REFLEX MICROSCOPIC - Abnormal; Notable for the following components:   APPearance HAZY (*)    Hgb urine dipstick SMALL (*)    Leukocytes,Ua LARGE (*)    Bacteria, UA FEW (*)    All other components within normal limits  URINE CULTURE  LIPASE, BLOOD  I-STAT BETA HCG BLOOD, ED ([facility], [facility], [facility] ONLY)    EKG None  Radiology Ct Abdomen Pelvis W Contrast  Result Date: 07/31/2019 CLINICAL DATA:  Abdominal pain EXAM: CT ABDOMEN AND PELVIS WITH CONTRAST TECHNIQUE: Multidetector CT imaging of the abdomen and pelvis was performed using the standard protocol following bolus administration of intravenous contrast. CONTRAST:  OMNIPAQUE IOHEXOL 300 MG/ML  SOLN COMPARISON:  None. FINDINGS: Lower chest: No acute abnormality. Hepatobiliary: No focal liver abnormality is seen. No gallstones, gallbladder wall thickening, or biliary dilatation. Pancreas: Unremarkable. No pancreatic ductal dilatation or surrounding inflammatory changes. Spleen: Normal in size without focal  abnormality. Adrenals/Urinary Tract: Normal adrenal glands. The kidneys are unremarkable. No mass or hydronephrosis. Urinary bladder is normal. Stomach/Bowel: Stomach is within normal limits. Not visualized. No small bowel wall thickening, inflammation or distension. There is multiple colonic diverticula noted. Wall thickening and inflammation involving the descending colon is identified compatible with acute diverticulitis. No free air. No abscess. Vascular/Lymphatic: No significant vascular findings are present. No enlarged abdominal or pelvic lymph nodes. Reproductive: Uterus and bilateral adnexa are unremarkable. Other: Free fluid is identified extending along the left pericolic gutter. No mature fluid collection identified to suggest abscess. Musculoskeletal: No acute or significant osseous findings. IMPRESSION: Examination is positive for acute diverticulitis involving the descending colon. No evidence for perforation or abscess formation. Electronically Signed   By: Signa Kell M.D.   On: 07/31/2019 10:03    Procedures Procedures (including critical care time)  Medications Ordered in ED Medications  oxyCODONE-acetaminophen (PERCOCET/ROXICET) 5-325 MG per tablet 1 tablet (1 tablet Oral Given 07/31/19 0846)  metoprolol tartrate (LOPRESSOR) tablet 50 mg (50 mg Oral Given 07/31/19 0846)  iohexol (OMNIPAQUE) 300 MG/ML solution 100 mL (100 mLs Intravenous Contrast Given 07/31/19 0913)  ciprofloxacin (CIPRO) tablet 500 mg (500 mg Oral Given 07/31/19 1048)  metroNIDAZOLE (FLAGYL) tablet 500 mg (500 mg Oral Given 07/31/19 1048)     Initial Impression / Assessment and Plan / ED Course  I have reviewed the triage vital signs and the nursing notes.  Pertinent labs & imaging results that were available during my care of the patient were reviewed by me and considered in my medical decision making (see chart for details).        Patient is a 53 year old female with history of physical exam as  above who presents emergency department for evaluation of left lower quadrant abdominal pain as well as some left flank pain accompanied by dysuria for the last several days and diarrhea for the past 2 days.  No fevers, chills, or other systemic symptoms.  No nausea or vomiting.  No hematuria or vaginal discharge.  Patient states that she has had this pain before multiple times and has been associated with urinary tract infection.  Her abdomen is soft and has mild diffuse tenderness to palpation with some predominance on the left side into the left flank.  Left CVA tenderness.  Labs were obtained which  demonstrated mild leukocytosis and dirty urine sample which is nitrite negative but could be consistent with urinary tract infection particularly given patient's symptoms.  As patient's pain is in the lower part of her abdomen predominantly I recommended that we proceed with pelvic exam for further evaluation of intrapelvic pathology including cervicitis, TOA, or other intrapelvic pathology.  Patient declined.  I also offered her blind swab for BV/GC however she declined this as well.  CT abdomen pelvis was obtained to evaluate her symptoms.  Results demonstrated findings consistent with diverticulitis.  Patient was initiated on oral antibiotics.  First dose given in the emergency department.  Discharged with written prescription for Cipro and Flagyl.  Recommended primary care follow-up.  Patient was given my usual customary discussion regarding diverticulitis including anticipatory guidance and strict return precautions.  Patient verbalized understanding.  She is able to tolerate oral intake in the emergency department without difficulty.  Patient seen in conjunction with my attending physician.  Final Clinical Impressions(s) / ED Diagnoses   Final diagnoses:  Diverticulitis    ED Discharge Orders         Ordered    ciprofloxacin (CIPRO) 500 MG tablet  Every 12 hours     07/31/19 1032    metroNIDAZOLE  (FLAGYL) 500 MG tablet  3 times daily     07/31/19 1032           Jonna Clarkyder, Giah Fickett, MD 07/31/19 1054    Eber HongMiller, Brian, MD 08/01/19 [phone]

## 2019-07-31 NOTE — ED Provider Notes (Signed)
I saw and evaluated the patient, reviewed the resident's note and I agree with the findings and plan.  Pertinent History: This patient is a 53 year old female presenting with acute onset of left lower and lower abdominal pain which started at 2 AM, worse with palpation on my exam, she has associated nausea, she has a prior history of cesarean section but no other abdominal surgery.  Pertinent Exam findings: Obese, tender in the left lower quadrant with mild guarding, no right lower quadrant or right upper quadrant tenderness, otherwise well-appearing  I was personally present and directly supervised the following procedures:  Medical evaluation  Patient will need a CT scan to rule out diverticulitis or other complicating factor, with acute onset would also consider kidney stone, urinary tract infection or pyelonephritis, urine does have some inflammatory markers, this could be primary urinary infection or inflammatory response to intra-abdominal process.  Patient agreeable  I personally interpreted the EKG as well as the resident and agree with the interpretation on the resident's chart.  Final diagnoses:  Diverticulitis      Noemi Chapel, MD 08/01/19 [phone]

## 2019-08-01 LAB — URINE CULTURE

## 2019-08-02 ENCOUNTER — Telehealth: Payer: Self-pay | Admitting: *Deleted

## 2019-08-02 NOTE — Telephone Encounter (Signed)
Pt called regarding Rx not being sent to pharmacy.  EDCM reviewed chart to find Rx was printed and pt has it in hand.  EDCM advised she take the Rx to her pharmacy to be filled.  Pt verbalized understanding of directions.

## 2021-02-15 ENCOUNTER — Emergency Department (HOSPITAL_COMMUNITY)
Admission: EM | Admit: 2021-02-15 | Discharge: 2021-02-16 | Disposition: A | Discharge status: Home / Self Care | Payer: Medicaid Other | Attending: Emergency Medicine | Admitting: Emergency Medicine

## 2021-02-15 ENCOUNTER — Other Ambulatory Visit: Payer: Self-pay

## 2021-02-15 DIAGNOSIS — Y906 Blood alcohol level of 120-199 mg/100 ml: Secondary | ICD-10-CM | POA: Diagnosis not present

## 2021-02-15 DIAGNOSIS — F17210 Nicotine dependence, cigarettes, uncomplicated: Secondary | ICD-10-CM | POA: Insufficient documentation

## 2021-02-15 DIAGNOSIS — Z79899 Other long term (current) drug therapy: Secondary | ICD-10-CM | POA: Diagnosis not present

## 2021-02-15 DIAGNOSIS — I10 Essential (primary) hypertension: Secondary | ICD-10-CM | POA: Insufficient documentation

## 2021-02-15 DIAGNOSIS — Z20822 Contact with and (suspected) exposure to covid-19: Secondary | ICD-10-CM | POA: Insufficient documentation

## 2021-02-15 DIAGNOSIS — F1010 Alcohol abuse, uncomplicated: Secondary | ICD-10-CM | POA: Diagnosis present

## 2021-02-15 DIAGNOSIS — R45851 Suicidal ideations: Secondary | ICD-10-CM | POA: Diagnosis not present

## 2021-02-16 LAB — SALICYLATE LEVEL: Salicylate Lvl: <7 mg/dL — ABNORMAL LOW (ref 7.0–30.0)

## 2021-02-16 LAB — RESP PANEL BY RT-PCR (FLU A&B, COVID) ARPGX2
Influenza A by PCR: NEGATIVE
Influenza B by PCR: NEGATIVE
SARS Coronavirus 2 by RT PCR: NEGATIVE

## 2021-02-16 LAB — RAPID URINE DRUG SCREEN, HOSP PERFORMED
Amphetamines: NOT DETECTED
Barbiturates: NOT DETECTED
Benzodiazepines: NOT DETECTED
Cocaine: NOT DETECTED
Opiates: NOT DETECTED
Tetrahydrocannabinol: NOT DETECTED

## 2021-02-16 LAB — COMPREHENSIVE METABOLIC PANEL
ALT: 38 U/L (ref 0–44)
AST: 63 U/L — ABNORMAL HIGH (ref 15–41)
Albumin: 3.8 g/dL (ref 3.5–5.0)
Alkaline Phosphatase: 86 U/L (ref 38–126)
Anion gap: 12 (ref 5–15)
BUN: 12 mg/dL (ref 6–20)
CO2: 21 mmol/L — ABNORMAL LOW (ref 22–32)
Calcium: 8.7 mg/dL — ABNORMAL LOW (ref 8.9–10.3)
Chloride: 104 mmol/L (ref 98–111)
Creatinine, Ser: 0.69 mg/dL (ref 0.44–1.00)
GFR, Estimated: >60 mL/min (ref >60)
Glucose, Bld: 86 mg/dL (ref 70–99)
Potassium: 2.9 mmol/L — ABNORMAL LOW (ref 3.5–5.1)
Sodium: 137 mmol/L (ref 135–145)
Total Bilirubin: 0.7 mg/dL (ref 0.3–1.2)
Total Protein: 7.2 g/dL (ref 6.5–8.1)

## 2021-02-16 LAB — ETHANOL: Alcohol, Ethyl (B): 192 mg/dL — ABNORMAL HIGH (ref <10)

## 2021-02-16 LAB — ACETAMINOPHEN LEVEL: Acetaminophen (Tylenol), Serum: <10 ug/mL — ABNORMAL LOW (ref 10–30)

## 2021-02-16 LAB — CBC
HCT: 40 % (ref 36.0–46.0)
Hemoglobin: 12.7 g/dL (ref 12.0–15.0)
MCH: 24.4 pg — ABNORMAL LOW (ref 26.0–34.0)
MCHC: 31.8 g/dL (ref 30.0–36.0)
MCV: 76.9 fL — ABNORMAL LOW (ref 80.0–100.0)
Platelets: 193 10*3/uL (ref 150–400)
RBC: 5.2 MIL/uL — ABNORMAL HIGH (ref 3.87–5.11)
RDW: 14.8 % (ref 11.5–15.5)
WBC: 5.8 10*3/uL (ref 4.0–10.5)
nRBC: 0 % (ref 0.0–0.2)

## 2021-02-16 LAB — I-STAT BETA HCG BLOOD, ED (MC, WL, AP ONLY): I-stat hCG, quantitative: <5 m[IU]/mL (ref <5)

## 2021-02-16 MED ORDER — POTASSIUM CHLORIDE CRYS ER 20 MEQ PO TBCR
40.0000 meq | EXTENDED_RELEASE_TABLET | Freq: Once | ORAL | Status: AC
  Administered 2021-02-16: 40 meq via ORAL
  Filled 2021-02-16: qty 2
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
## 2021-02-16 NOTE — ED Notes (Signed)
2 belonging bags returned to pt

## 2021-02-16 NOTE — BH Assessment (Signed)
Disposition: Per [facility] Provider Earlene Plater, MD), patient is psych cleared. She is ok to discharge home. Patient recommended to follow up with the [facility] for outpatient therapy and medication management. [facility] walk-in hours to be included). Disposition Counselor Doylene Canning) to insert referral information into patient's AVS.   Clinician update patient's nurse Nash Dimmer, RN), EDP (Dr. Norman Clay), Disposition Counselor Doylene Canning), charge nurse Darl Pikes, RN) of patient's updated disposition status via secure chat.

## 2021-02-16 NOTE — Discharge Instructions (Signed)
For your behavioral health needs, you are advised to follow up with [facility]:      [facility]      [address]      [place], [place] [address]      [phone]      They offer psychiatry/medication management, therapy and substance abuse treatment.  New patients are being seen in their walk-in clinic.  Walk-in hours are Monday - Thursday from 8:00 am - 11:00 am for psychiatry, and Friday from 1:00 pm - 4:00 pm for therapy.  Walk-in patients are seen on a first come, first served basis, so try to arrive as early as possible for the best chance of being seen the same day.  

## 2021-02-16 NOTE — ED Provider Notes (Signed)
Evaluated by crisis team and discharged for continued outpatient care.  Recommending immediate return for worsening symptoms or any additional concerns.    Cheryll Cockayne, MD 02/16/21 [phone]

## 2021-02-16 NOTE — BH Assessment (Addendum)
Requested nursing Nash Dimmer, RN) to place TTS machine in patient's room for the initial TTS assessment.

## 2021-02-16 NOTE — BH Assessment (Signed)
[facility] Assessment Progress Note  Per Earlene Plater, MD, this voluntary pt does not require psychiatric hospitalization at this time.  Pt is psychiatrically cleared.  Discharge instructions include referral information for [facility].  EDP Norman Clay, MD and pt's nurse, Nash Dimmer, have been notified.  Doylene Canning, MA Triage Specialist [phone]

## 2021-02-16 NOTE — ED Notes (Signed)
[place] provided

## 2021-02-16 NOTE — BH Assessment (Signed)
At 0405, clinician messaged Linford Arnold, RN via secure chat in Epic, to see if the pt is able to engage in TTS assessment and if the pt is under IVC.   Clinician awaiting response.     Redmond Pulling, MS, [facility], [facility] Triage Specialist [phone]

## 2021-02-16 NOTE — BH Assessment (Signed)
Per Linford Arnold, RN via secure chat in [place], pt is not under IVC but is currently sleeping and unable to engage in TTS assessment. Clinician asked RN to let TTS staff know when the pt is able to be assessed.    Redmond Pulling, MS, [facility], [facility] Triage Specialist [phone]
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
## 2021-02-16 NOTE — BH Assessment (Addendum)
Comprehensive Clinical Assessment (CCA) Note  02/16/2021 Amy Conway [id number]   Disposition: Per [facility] Provider Earlene Plater, MD), patient is psych cleared. She is ok to discharge home. Patient recommended to follow up with the [facility] for outpatient therapy and medication management. [facility] walk-in hours to be included). Disposition Counselor Doylene Canning) to insert referral information into patient's AVS. The [place]-SUICIDE SEVERITY RATING SCALE (C-SSRS) was completed. However, no 1:1 sitter recommendations are warranted at this time as patient has been psych cleared.    Clinician update patient's nurse Nash Dimmer, RN), EDP (Dr. Norman Clay), Disposition Counselor Doylene Canning), charge nurse Darl Pikes, RN) of patient's updated disposition status via secure chat.   The patient demonstrates the following risk factors for suicide: Chronic risk factors for suicide include: psychiatric disorder of Depressie Disorder, Severe and Alcohol Intoxication . Acute risk factors for suicide include: family or marital conflict. Protective factors for this patient include: positive social support and responsibility to others (children, family). Considering these factors, the overall suicide risk at this point appears to be "High Risk". Patient is appropriate for outpatient follow up.  The [place]UM[place]IA-SUICIDE SEVERITY RATING SCALE (C-SSRS) was completed. However, no 1:1 sitter recommendations are warranted at this time as patient has been psych cleared.    Flowsheet Row ED from 02/15/2021 in [facility]  C-SSRS RISK CATEGORY High Risk      Chief Complaint:  Chief Complaint  Patient presents with  . Suicidal   Visit Diagnosis: Depressive Disorder, Severe; Anxiety Disorder; Alcohol Intoxication   Amy Conway is a 55 year old female with history of alcohol abuse, acid reflux, gout, homelessness, hypertension, presenting to the ED with reported suicidal ideation.  She  arrived to the ED via EMS. States that she called 911 and told them that she was about to commit suicide. The onset of suicidal thoughts started, 02/15/2021.  Patient stating, "I've been a little depressed for a couple days" (approximately 2 weeks). The trigger is due to "bad communication with daughter". Says that her daughter is graduating this weekend and she doesn't really want to attend the graduation.  Patient denies current suicidal thoughts. However, felt suicidal yesterday. She denies that she ever had plan and/or intent to harm herself. Denies a prior history of suicide attempts and/or gesture. 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 Additional stressors reported: "My blood pressure is always up and down and my eyes are always blood shot red". Denies a history of self-mutilating behaviors. Current depressive symptoms: hopelessness, despondence, anger/irritability, and insomnia. She sleeps 5-6 hrs per night. Appetite is "up and down". She reports weight loss. However, doesn't know the amount that she has loss. States, "When I try on different clothing I can tell I loss a lot of weight, I have bad diarrhea, sometimes I even pee on myself and don't even realize that I did, and I don't really eat alot".  Denies a family history of mental health illness. Denies a history of abuse and/or trauma. Patient is currently employed and was suppose to start her new job this morning at 8am. She was hired at the [employer]" off [employer] in [place]. Highest level of education is [employer]. Patient reports that she has a support system through a program she just joined but doesn't know the name of the program.   Denies homicidal ideations. Denies a history of aggression and/or assaultive behaviors. However, has current legal charges for assault. Court date March 30, 2021. Patient stating the incident was between  her and a boyfriend she has been with for 16 yrs.   Patient denies  AVH's. Denies a history of drugs use. However, she drinks alcohol occasionally and socially. Her last use of alcohol was Thursday, 02/08/2021. The amount of alcohol was # 2 cans of beer. She denies a history of inpatient psychiatric treatment. Denies that she has an outpatient therapist and/or psychiatrist.   Patient provides verbal consent to speak with her boyfriend of 16 yrs; Guadelupe Sabin: #[phone] for collateral information. "My only cocern is why did her foot swell up like that.that's it". The boyfriend has no knowledge of patient's mental health issues. States, "I just know that she started rambing and I didn't know what the hell she was talking about". States that he has never seen patient "ramble" like that since they have been together (16 yrs). He has no safety concerns for patient being a danger to self or others. Denies that he has concerns for patient experiencing AVH's. Overall, Mr. Delena Serve has no safety concerns. He feels that patient would be safe if discharged home today. The boyfriend is also willing to provide emotional support. States, "We have a daughter that is graduating from [employer] this weekend, we have a planned cook out, and I think she will be fine".   CCA Screening, Triage and Referral (STR)  Patient Reported Information How did you hear about [place]? Other (Comment)  Referral name: 911 called by patient; states that EMS transported her to the Emergency Department.  Referral phone number: No data recorded  Whom do you see for routine medical problems? Primary Care  Practice/Facility Name: "Some place beside [employer]...Marland KitchenMarland KitchenI can't remember the name of the place"  Practice/Facility Phone Number: 0 (n/a)  Name of Contact: unknown  Contact Number: unknown  Contact Fax Number: unknown  Prescriber Name: unknown  Prescriber Address (if known): unknown   What Is the Reason for Your Visit/Call Today? unknown  How Long Has This Been Causing You Problems?  <Week  What Do You Feel Would Help You the Most Today? Treatment for Depression or other mood problem; Stress Management; Medication(s)   Have You Recently Been in Any Inpatient Treatment (Hospital/Detox/Crisis Center/28-Day Program)? No  Name/Location of Program/Hospital:No data recorded How Long Were You There? No data recorded When Were You Discharged? No data recorded  Have You Ever Received Services From [facility] Before? Yes  Who Do You See at [facility]? medical only   Have You Recently Had Any Thoughts About Hurting Yourself? Yes  Are You Planning to Commit Suicide/Harm Yourself At This time? No   Have you Recently Had Thoughts About Hurting Someone [name]? No  Explanation: No data recorded  Have You Used Any Alcohol or Drugs in the Past 24 Hours? No  How Long Ago Did You Use Drugs or Alcohol? No data recorded What Did You Use and How Much? No data recorded  Do You Currently Have a Therapist/Psychiatrist? No  Name of Therapist/Psychiatrist: No data recorded  Have You Been Recently Discharged From Any Office Practice or Programs? No  Explanation of Discharge From Practice/Program: No data recorded    CCA Screening Triage Referral Assessment Type of Contact: Tele-Assessment  Is this Initial or Reassessment? Initial Assessment  Date Telepsych consult ordered in [facility]:  02/16/2021  Time Telepsych consult ordered in [facility]:  No data recorded  Patient Reported Information Reviewed? Yes  Patient Left Without Being Seen? No data recorded Reason for Not Completing Assessment: No data recorded  Collateral Involvement: Patient provides  verbal consent to speak with her boyfriend of 16 yrs; Guadelupe Sabin: #[phone]   Does Patient Have a Court Appointed Legal Guardian? No data recorded Name and Contact of Legal Guardian: No data recorded If Minor and Not Living with Parent(s), Who has Custody? No data recorded Is CPS involved or ever been involved? Never  Is  APS involved or ever been involved? Never   Patient Determined To Be At Risk for Harm To Self or Others Based on Review of Patient Reported Information or Presenting Complaint? No  Method: No data recorded Availability of Means: No data recorded Intent: No data recorded Notification Required: No data recorded Additional Information for Danger to Others Potential: No data recorded Additional Comments for Danger to Others Potential: No data recorded Are There Guns or Other Weapons in Your Home? No data recorded Types of Guns/Weapons: No data recorded Are These Weapons Safely Secured?                            No data recorded Who Could Verify You Are Able To Have These Secured: No data recorded Do You Have any Outstanding Charges, Pending Court Dates, Parole/Probation? No data recorded Contacted To Inform of Risk of Harm To Self or Others: No data recorded  [facility]: [facility]   Does Patient Present under Involuntary Commitment? No  IVC Papers Initial File Date: No data recorded  [place] of Residence: [place]   Patient Currently Receiving the Following Services: -- (patient does not have any services in place at this time.)   Determination of Need: Emergent (2 hours)   Options For Referral: Medication Management; Intensive Outpatient Therapy; Outpatient Therapy     CCA Biopsychosocial Intake/Chief Complaint:  Shavon Ashmore is a 55 year old female with history of alcohol abuse, acid reflux, gout, homelessness, hypertension, presenting to the ED with reported suicidal ideation.  She arrived to the ED via EMS. States that she called 911 and told them that she was about to commit suicide. The onset of suicidal thoughts started, 02/15/2021.  Patient stating, "I've been a little depressed for a couple days" (approximately 2 weeks). The trigger is due to "bad communication with daughter". Says that her daughter is graduating this weekend and she doesn't really want to go.    Patient denies current suicidal thoughts. However, felt suicidal yesterday. She denies that she ever had plan and/or intent to harm herself. Denies a prior history of suicide attempts and/or gesture. 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 Additional stressors reported: "My blood pressure is always up and down and my eyes are always blood shot red". Denies a history of self-mutilating behaviors. Current depressive symptoms: hopelessness, despondence, anger/irritability, and insomnia. She sleeps 5-6 hrs per night. Appetite is "up and down". She reports weight loss. However, doesn't know the amount that she has loss. States, "When I try on different clothing I can tell I loss a lot of weight, I have bad diarrhea, sometimes I even pee on myself and don't even realize that I did, and I don't really eat alot".  Denies a family history of mental health illness. Denies a history of abuse and/or trauma. Patient is currently employed and was suppose to start her new job this morning at 8am. She was hired at the [employer]" off [employer] in [place]. Highest level of education is [employer]. Patient reports that she has a support system through a program she just joined but doesn't  know the name of the program.   Denies homicidal ideations. Denies a history of aggression and/or assaultive behaviors. However, has current legal charges for assault. Court date March 30, 2021. Patient stating the incident was between her and a boyfriend she has been with for 16 yrs.   Patient denies AVH's. Denies a history of drugs use. However, she drinks alcohol occasionally and socially. Her last use of alcohol was Thursday, 02/08/2021. The amount of alcohol was # 2 cans of beer.  She denies a history of inpatient psychiatric treatment. Denies that she has an outpatient therapist and/or psychiatrist.  Current Symptoms/Problems: Current depressive symptoms: hopelessness, despondence, anger/irritability,  and insomnia.   Patient Reported Schizophrenia/Schizoaffective Diagnosis in Past: No   Strengths: SEE MAR  Preferences: SEE MAR  Abilities: SEE MAR   Type of Services Patient Feels are Needed: SEE MAR   Initial Clinical Notes/Concerns: SEE MAR   Mental Health Symptoms Depression:  Difficulty Concentrating; Hopelessness; Irritability   Duration of Depressive symptoms: Greater than two weeks   Mania:  Irritability   Anxiety:   Fatigue; Irritability   Psychosis:  None   Duration of Psychotic symptoms: No data recorded  Trauma:  None   Obsessions:  None   Compulsions:  None   Inattention:  None   Hyperactivity/Impulsivity:  N/A   Oppositional/Defiant Behaviors:  None   Emotional Irregularity:  None   Other Mood/Personality Symptoms:  Current depressive symptoms: hopelessness, despondence, anger/irritability, and insomnia.    Mental Status Exam Appearance and self-care  Stature:  Average   Weight:  Average weight   Clothing:  Neat/clean   Grooming:  Normal   Cosmetic use:  Age appropriate   Posture/gait:  Normal   Motor activity:  Not Remarkable   Sensorium  Attention:  Normal   Concentration:  Normal   Orientation:  Time; Situation; Place; Person; Object   Recall/memory:  Normal   Affect and Mood  Affect:  Depressed; Flat; Anxious   Mood:  Anxious   Relating  Eye contact:  Normal   Facial expression:  Anxious   Attitude toward examiner:  Cooperative   Thought and Language  Speech flow: Clear and Coherent   Thought content:  Appropriate to Mood and Circumstances   Preoccupation:  None   Hallucinations:  None   Organization:  No data recorded  Affiliated Computer ServicesExecutive Functions  Fund of Knowledge:  Good   Intelligence:  Average   Abstraction:  Normal   Judgement:  Fair   Reality Testing:  Adequate   Insight:  Poor   Decision Making:  Normal   Social Functioning  Social Maturity:  Impulsive   Social Judgement:  Heedless    Stress  Stressors:  Other (Comment) (conflict with daughter)   Coping Ability:  Normal   Skill Deficits:  Communication; Interpersonal   Supports:  Other (Comment) (boyfriend of 16 yrs)     Religion: Religion/Spirituality Are You A Religious Person?: No  Leisure/Recreation: Leisure / Recreation Do You Have Hobbies?: No  Exercise/Diet: Exercise/Diet Do You Exercise?: No Have You Gained or Lost A Significant Amount of Weight in the Past Six Months?: Yes-Lost Number of Pounds Lost?:  (Appetite is "up and down". She reports weight loss. However, doesn't know the amount that she has loss. States, "When I try on different clothing I can tell I loss a lot of weight".) Do You Follow a Special Diet?: No Do You Have Any Trouble Sleeping?: Yes Explanation of Sleeping Difficulties: She sleeps 5-6 hrs per night.  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 How long has patient been employed?: Today is suppose to be her first day; 8am Patient's job has been impacted by current illness: No What is the longest time patient has a held a job?: 6 -7 months Where was the patient employed at that time?: [employer] Has patient ever been in the [employer]?: No  Education: Education Is Patient Currently Attending School?: No Last Grade Completed:  (12th grade) Name of High School: [employer] Did You Graduate From [employer]?: Yes Did Theme park manager?: No Did You Attend Graduate School?: No Did You Have Any Special Interests In School?: [employer] Did You Have An Individualized Education Program (IIEP): No Did You Have Any Difficulty At School?: No Patient's Education Has Been Impacted by Current Illness: Yes How Does Current Illness Impact Education?: Yes.Marland KitchenMarland Kitchen"Because I loss my parents at a young age"   CCA Family/Childhood History Family and Relationship  History: Family history Marital status: Divorced Divorced, when?: 5-6 yrs ago What types of issues is patient dealing with in the relationship?: unknown Additional relationship information: unknown Are you sexually active?:  (unknown) What is your sexual orientation?: unknown Has your sexual activity been affected by drugs, alcohol, medication, or emotional stress?: unknown Does patient have children?: Yes (2 children (son that is 25 and daugher 57)) How many children?:  (2) How is patient's relationship with their children?: patient doesn't get along with daugher; she hasn't seen her son in 3 yrs  Childhood History:  Childhood History By whom was/is the patient raised?: Grandparents Additional childhood history information: raised by her maternal grandmother Description of patient's relationship with caregiver when they were a child: patient reports having a good relationship with her grandmother Patient's description of current relationship with people who raised him/her: patient reports having a good relationship with her grandmother How were you disciplined when you got in trouble as a child/adolescent?: patient was the only child so "I got my way alot". patient did not identify any ways in which she was displined Does patient have siblings?: No Did patient suffer any verbal/emotional/physical/sexual abuse as a child?: No Did patient suffer from severe childhood neglect?: No Has patient ever been sexually abused/assaulted/raped as an adolescent or adult?: No Was the patient ever a victim of a crime or a disaster?: No Witnessed domestic violence?: No Has patient been affected by domestic violence as an adult?: No  Child/Adolescent Assessment:     CCA Substance Use Alcohol/Drug Use: Alcohol / Drug Use Pain Medications: See MAR Prescriptions: See MAR Over the Counter: See MAR History of alcohol / drug use?: Yes Longest period of sobriety (when/how long): unknown Negative  Consequences of Use:  (none reported) Withdrawal Symptoms:  (none reported) Substance #1 Name of Substance 1: Alcohol 1 - Age of First Use: unknown 1 - Amount (size/oz): # 2 cans of beer. 1 - Frequency: she reports drinking alcohol occasionally and socially only 1 - Duration: on-going 1 - Last Use / Amount: patient stating that her last use was 02/08/2021 (last Thursday).Marland KitchenMarland KitchenMarland Kitchenhowever her alcohol level was 192 at the time of her arrival to the ED 1 - Method of Aquiring: Purchase from store 1- Route of Use: oral                       ASAM's:  Six Dimensions of Multidimensional Assessment  Dimension 1:  Acute Intoxication and/or Withdrawal Potential:  Dimension 2:  Biomedical Conditions and Complications:      Dimension 3:  Emotional, Behavioral, or Cognitive Conditions and Complications:     Dimension 4:  Readiness to Change:     Dimension 5:  Relapse, Continued use, or Continued Problem Potential:     Dimension 6:  Recovery/Living Environment:     ASAM Severity Score:    ASAM Recommended Level of Treatment: ASAM Recommended Level of Treatment: Level I Outpatient Treatment   Substance use Disorder (SUD) Substance Use Disorder (SUD)  Checklist Symptoms of Substance Use: Social, occupational, recreational activities given up or reduced due to use  Recommendations for Services/Supports/Treatments: Recommendations for Services/Supports/Treatments Recommendations For Services/Supports/Treatments: Medication Management,Individual Therapy  DSM5 Diagnoses: Patient Active Problem List   Diagnosis Date Noted  . Syncope 05/24/2014  . Alcohol abuse 04/01/2014  . Viral URI 06/23/2013    Patient Centered Plan: Patient is on the following Treatment Plan(s):  Anxiety, Depression and Substance Abuse   Referrals to Alternative Service(s): Referred to Alternative Service(s):   Place:   Date:   Time:    Referred to Alternative Service(s):   Place:   Date:   Time:    Referred to  Alternative Service(s):   Place:   Date:   Time:    Referred to Alternative Service(s):   Place:   Date:   Time:     Melynda Ripple, Counselor

## 2021-02-16 NOTE — BH Assessment (Signed)
Attempted to call [facility] SP[facility] cart to complete patient's initial tele psych x2. No answer. TTS continuing to make attempts to assess patient.

## 2021-02-16 NOTE — BH Assessment (Signed)
Charge nurse Darl Pikes, RN) contacted to request assistance with getting TTS cart in patient's room for the initial TTS assessment. TTS continuing to make attempts to assess patient.

## 2021-05-13 ENCOUNTER — Encounter (HOSPITAL_COMMUNITY): Payer: Self-pay | Admitting: Emergency Medicine

## 2021-05-13 ENCOUNTER — Other Ambulatory Visit: Payer: Self-pay

## 2021-05-13 ENCOUNTER — Emergency Department (HOSPITAL_COMMUNITY)
Admission: EM | Admit: 2021-05-13 | Discharge: 2021-05-14 | Disposition: A | Discharge status: Home / Self Care | Payer: Medicaid Other | Attending: Emergency Medicine | Admitting: Emergency Medicine

## 2021-05-13 DIAGNOSIS — I10 Essential (primary) hypertension: Secondary | ICD-10-CM | POA: Insufficient documentation

## 2021-05-13 DIAGNOSIS — Y908 Blood alcohol level of 240 mg/100 ml or more: Secondary | ICD-10-CM | POA: Diagnosis not present

## 2021-05-13 DIAGNOSIS — F329 Major depressive disorder, single episode, unspecified: Secondary | ICD-10-CM | POA: Insufficient documentation

## 2021-05-13 DIAGNOSIS — F17210 Nicotine dependence, cigarettes, uncomplicated: Secondary | ICD-10-CM | POA: Diagnosis not present

## 2021-05-13 DIAGNOSIS — Z79899 Other long term (current) drug therapy: Secondary | ICD-10-CM | POA: Insufficient documentation

## 2021-05-13 DIAGNOSIS — R45851 Suicidal ideations: Secondary | ICD-10-CM

## 2021-05-13 DIAGNOSIS — Z20822 Contact with and (suspected) exposure to covid-19: Secondary | ICD-10-CM | POA: Insufficient documentation

## 2021-05-13 DIAGNOSIS — R079 Chest pain, unspecified: Secondary | ICD-10-CM | POA: Diagnosis present

## 2021-05-13 LAB — RAPID URINE DRUG SCREEN, HOSP PERFORMED
Amphetamines: NOT DETECTED
Barbiturates: NOT DETECTED
Benzodiazepines: NOT DETECTED
Cocaine: NOT DETECTED
Opiates: NOT DETECTED
Tetrahydrocannabinol: NOT DETECTED

## 2021-05-13 LAB — COMPREHENSIVE METABOLIC PANEL
ALT: 28 U/L (ref 0–44)
AST: 39 U/L (ref 15–41)
Albumin: 3.7 g/dL (ref 3.5–5.0)
Alkaline Phosphatase: 88 U/L (ref 38–126)
Anion gap: 10 (ref 5–15)
BUN: 13 mg/dL (ref 6–20)
CO2: 23 mmol/L (ref 22–32)
Calcium: 8.8 mg/dL — ABNORMAL LOW (ref 8.9–10.3)
Chloride: 105 mmol/L (ref 98–111)
Creatinine, Ser: 0.95 mg/dL (ref 0.44–1.00)
GFR, Estimated: >60 mL/min (ref >60)
Glucose, Bld: 79 mg/dL (ref 70–99)
Potassium: 3.9 mmol/L (ref 3.5–5.1)
Sodium: 138 mmol/L (ref 135–145)
Total Bilirubin: 0.6 mg/dL (ref 0.3–1.2)
Total Protein: 6.9 g/dL (ref 6.5–8.1)

## 2021-05-13 LAB — CBC WITH DIFFERENTIAL/PLATELET
Abs Immature Granulocytes: 0.01 10*3/uL (ref 0.00–0.07)
Basophils Absolute: 0 10*3/uL (ref 0.0–0.1)
Basophils Relative: 0 %
Eosinophils Absolute: 0.1 10*3/uL (ref 0.0–0.5)
Eosinophils Relative: 2 %
HCT: 39.7 % (ref 36.0–46.0)
Hemoglobin: 12.7 g/dL (ref 12.0–15.0)
Immature Granulocytes: 0 %
Lymphocytes Relative: 53 %
Lymphs Abs: 3.3 10*3/uL (ref 0.7–4.0)
MCH: 24.7 pg — ABNORMAL LOW (ref 26.0–34.0)
MCHC: 32 g/dL (ref 30.0–36.0)
MCV: 77.1 fL — ABNORMAL LOW (ref 80.0–100.0)
Monocytes Absolute: 0.4 10*3/uL (ref 0.1–1.0)
Monocytes Relative: 6 %
Neutro Abs: 2.5 10*3/uL (ref 1.7–7.7)
Neutrophils Relative %: 39 %
Platelets: 219 10*3/uL (ref 150–400)
RBC: 5.15 MIL/uL — ABNORMAL HIGH (ref 3.87–5.11)
RDW: 14.7 % (ref 11.5–15.5)
WBC: 6.3 10*3/uL (ref 4.0–10.5)
nRBC: 0 % (ref 0.0–0.2)

## 2021-05-13 LAB — TROPONIN I (HIGH SENSITIVITY): Troponin I (High Sensitivity): 5 ng/L (ref <18)

## 2021-05-13 LAB — SALICYLATE LEVEL: Salicylate Lvl: <7 mg/dL — ABNORMAL LOW (ref 7.0–30.0)

## 2021-05-13 LAB — ETHANOL: Alcohol, Ethyl (B): 254 mg/dL — ABNORMAL HIGH (ref <10)

## 2021-05-13 LAB — ACETAMINOPHEN LEVEL: Acetaminophen (Tylenol), Serum: <10 ug/mL — ABNORMAL LOW (ref 10–30)

## 2021-05-13 MED ORDER — ZIPRASIDONE MESYLATE 20 MG IM SOLR
20.0000 mg | Freq: Once | INTRAMUSCULAR | Status: AC
  Administered 2021-05-13: 20 mg via INTRAMUSCULAR
  Filled 2021-05-13: qty 20

## 2021-05-13 MED ORDER — STERILE WATER FOR INJECTION IJ SOLN
INTRAMUSCULAR | Status: AC
  Filled 2021-05-13: qty 10

## 2021-05-13 NOTE — ED Triage Notes (Signed)
Per EMS, pt [place] bus stop reporting he has SOB, CP and told dispatch she was suicidal.Daughter reports hx.  Admits to ETOH.  Pt yelling, speaking, refusing to comply and she took a fighting stance to staff.  Given medicaion in triage

## 2021-05-13 NOTE — ED Provider Notes (Addendum)
Emergency Medicine Provider Triage Evaluation Note  Amy Conway , a 55 y.o. female  was evaluated in triage.  EMS initially called out for chest pain.  Once they arrived, complained of SI.  She is also highly intoxicated and belligerent in triage.    Review of Systems  Positive: Chest pain, SI, EtOH Negative: Cough, fever  Physical Exam  BP (!) 148/90 (BP Location: Left Arm)   Pulse 81   Resp 16   LMP 10/15/2013   SpO2 100%  Gen:   Awake, no distress   Resp:  Normal effort  MSK:   Moves extremities without difficulty  Other:  Belligerent, yelling and cursing at staff, refusing to stay in triage room  Medical Decision Making  Medically screening exam initiated at 10:07 PM.  Appropriate orders placed.  Amy Conway was informed that the remainder of the evaluation will be completed by another provider, this initial triage assessment does not replace that evaluation, and the importance of remaining in the ED until their evaluation is complete.  Belligerent in triage, refusing care, yelling and cursing at staff.  Given IM Geodon.  Will obtain labs, EKG.   Garlon Hatchet, PA-C 05/13/21 2223    Garlon Hatchet, PA-C 05/13/21 2225    Amy Monday, MD 05/14/21 [phone]

## 2021-05-14 LAB — RESP PANEL BY RT-PCR (FLU A&B, COVID) ARPGX2
Influenza A by PCR: NEGATIVE
Influenza B by PCR: NEGATIVE
SARS Coronavirus 2 by RT PCR: NEGATIVE

## 2021-05-14 MED ORDER — THIAMINE HCL 100 MG PO TABS
100.0000 mg | ORAL_TABLET | Freq: Every day | ORAL | Status: DC
Start: 2021-05-14 — End: 2021-05-14

## 2021-05-14 MED ORDER — LORAZEPAM 2 MG/ML IJ SOLN: 0.0000 mg | Freq: Two times a day (BID) | INTRAMUSCULAR | Status: DC

## 2021-05-14 MED ORDER — LORAZEPAM 1 MG PO TABS: 0.0000 mg | ORAL_TABLET | Freq: Two times a day (BID) | ORAL | Status: DC

## 2021-05-14 MED ORDER — LORAZEPAM 2 MG/ML IJ SOLN: 0.0000 mg | Freq: Four times a day (QID) | INTRAMUSCULAR | Status: DC

## 2021-05-14 MED ORDER — LORAZEPAM 1 MG PO TABS: 0.0000 mg | ORAL_TABLET | Freq: Four times a day (QID) | ORAL | Status: DC

## 2021-05-14 MED ORDER — THIAMINE HCL 100 MG/ML IJ SOLN: 100.0000 mg | Freq: Every day | INTRAMUSCULAR | Status: DC

## 2021-05-14 NOTE — Discharge Instructions (Signed)
For your behavioral health needs you are advised to follow up with [facility] at your earliest opportunity:      [facility]      [address]      [place], [place] [address]      [phone]      They offer psychiatry/medication management and therapy.  New patients are seen in their walk-in clinic.  Walk-in hours are Monday - Thursday from 8:00 am - 11:00 am for psychiatry, and Friday from 1:00 pm - 4:00 pm for therapy.  Walk-in patients are seen on a first come, first served basis, so try to arrive as early as possible for the best chance of being seen the same day.  To help you maintain a sober lifestyle, a substance use disorder treatment program may be beneficial to you.  Contact the [facility] at your earliest opportunity to ask about enrolling their program:       [facility]      [address] [place], [place] [address]      [phone]

## 2021-05-14 NOTE — ED Provider Notes (Signed)
Seen by psychiatry and cleared for outpatient care.  Advised return for worsening symptoms or any additional concerns.    Cheryll Cockayne, MD 05/14/21 1120

## 2021-05-14 NOTE — BH Assessment (Signed)
Clinician made contact with pt's nurse, Matt, RN, in regards to completing pt's MH Assessment. Pt's nurse shared he attempted to wake pt but was unsuccessful. TTS to attempt assessment at a later time.

## 2021-05-14 NOTE — ED Notes (Signed)
Attempted to complete a COVID swab however as soon as entering the nostril the patient pulled back and stated she refused the COVID swab. Sent to lab to see if there was enough to run the test

## 2021-05-14 NOTE — BH Assessment (Signed)
Comprehensive Clinical Assessment (CCA) Screening, Triage and Referral Note  05/14/2021 Amy Conway [id number]  Disposition: Per Caryn Bee, NP patient does not meet inpatient criteria.  Outpatient therapy is recommended.  Patient has been provided referral information for [facility].   The patient demonstrates the following risk factors for suicide: Chronic risk factors for suicide include: psychiatric disorder of untreated depression along with heavy drinking some weekends . Acute risk factors for suicide include: loss (financial, interpersonal, professional). Protective factors for this patient include: positive social support, responsibility to others (children, family), coping skills, and hope for the future. Considering these factors, the overall suicide risk at this point appears to be low. Patient is appropriate for outpatient follow up.   Patient is a 55 year old female/female with a history of depression, currently untreated who presents voluntarily via EMS to [facility] with c/o CP, SOB and SI.  Patient was intoxicated on arrival with BAL of 254.    Per EDP note: "Patient with past medical history notable for alcohol abuse and homelessness presents to the emergency department with a chief complaint of suicidal thoughts and chest pain.  Per EMS, they were initially called out for the chest pain, but once they arrived she complained of suicidal thoughts.  In triage she was noted to be very belligerent and seemed intoxicated.  She does have a significant history for alcohol abuse.  She was given Geodon in triage had moved to the back.  On my exam, patient is sleeping and unable to provide any history."    Upon assessment today, patient states she called EMS due to chest pain and having suicidal thoughts.  She denies having a plan or intent at the time, stating she was "feeling overwhelmed with life," admitting to passive SI.  Patient is currently homeless, after being evicted a month ago for  non-payment.  She has been staying with her cousin, but isn't feeling welcome and feels she is "in the way."  Patient works full time for [employer] on [employer] and states she has an interview about an apartment on Friday.  She is denying current SI and she denies hx of attempts.  She also denies HI, AVH and SA concerns.  She does admit to drinking "sometimes on the weekends" but does not drink during the work week. Patient identifies protective factors as her daughter, family and hopes to have an apartment soon so her daughter "can have a place to come home from college on the weekend."  Patient engaged in safety planning and is able to affirm her safety.  She is open to outpatient therapy referrals, stating she will consider counseling.   Chief Complaint:  Chief Complaint  Patient presents with   Chest Pain   Suicidal   Visit Diagnosis: Depressive Disorder Unspecified, untreated  Patient Reported Information How did you hear about [place]? Self  What Is the Reason for Your Visit/Call Today? Pt called EMS reporting CP, SOB and SI.  She was intoxicated with BAL of 254 on arrival. She was too somnolent to be assessed last night.  How Long Has This Been Causing You Problems? 1 wk - 1 month  What Do You Feel Would Help You the Most Today? Treatment for Depression or other mood problem   Have You Recently Had Any Thoughts About Hurting Yourself? Yes  Are You Planning to Commit Suicide/Harm Yourself At This time? No   Have you Recently Had Thoughts About Hurting Someone [name]? No  Are You Planning to Harm Someone  at This Time? No  Explanation: No data recorded  Have You Used Any Alcohol or Drugs in the Past 24 Hours? Yes  How Long Ago Did You Use Drugs or Alcohol? No data recorded What Did You Use and How Much? ETOH - 4 "tall cans of beer" yesterday   Do You Currently Have a Therapist/Psychiatrist? No  Name of Therapist/Psychiatrist: No data recorded  Have You Been Recently  Discharged From Any Office Practice or Programs? No  Explanation of Discharge From Practice/Program: No data recorded   CCA Screening Triage Referral Assessment Type of Contact: Face-to-Face  Telemedicine Service Delivery:   Is this Initial or Reassessment? Initial Assessment  Date Telepsych consult ordered in [facility]:  02/16/21  Time Telepsych consult ordered in [facility]:  No data recorded Location of Assessment: [facility] ED  Provider Location: Other (comment) (Provider saw pt in person at [facility]   Collateral Involvement: N/A   Does Patient Have a Automotive engineer Guardian? No data recorded Name and Contact of Legal Guardian: No data recorded If Minor and Not Living with Parent(s), Who has Custody? No data recorded Is CPS involved or ever been involved? Never  Is APS involved or ever been involved? Never   Patient Determined To Be At Risk for Harm To Self or Others Based on Review of Patient Reported Information or Presenting Complaint? No  Method: No data recorded Availability of Means: No data recorded Intent: No data recorded Notification Required: No data recorded Additional Information for Danger to Others Potential: No data recorded Additional Comments for Danger to Others Potential: No data recorded Are There Guns or Other Weapons in Your Home? No data recorded Types of Guns/Weapons: No data recorded Are These Weapons Safely Secured?                            No data recorded Who Could Verify You Are Able To Have These Secured: No data recorded Do You Have any Outstanding Charges, Pending Court Dates, Parole/Probation? No data recorded Contacted To Inform of Risk of Harm To Self or Others: No data recorded  Does Patient Present under Involuntary Commitment? No  IVC Papers Initial File Date: No data recorded  [place] of Residence: [place]   Patient Currently Receiving the Following Services: Not Receiving Services   Determination of Need: Routine (7  days)   Options For Referral: Outpatient Therapy   Discharge Disposition:  Outpatient therapy has been recommended, referral info provided for [facility].     Yetta Glassman, [facility]

## 2021-05-14 NOTE — ED Provider Notes (Signed)
[facility] EMERGENCY DEPARTMENT Provider Note   CSN: [id number] Arrival date & time: 05/13/21  2158     History Chief Complaint  Patient presents with   Chest Pain   Suicidal    Amy Conway is a 55 y.o. female.  Patient with past medical history notable for alcohol abuse and homelessness presents to the emergency department with a chief complaint of suicidal thoughts and chest pain.  Per EMS, they were initially called out for the chest pain, but once they arrived she complained of suicidal thoughts.  In triage she was noted to be very belligerent and seemed intoxicated.  She does have a significant history for alcohol abuse.  She was given Geodon in triage had moved to the back.  On my exam, patient is sleeping and unable to provide any history.  Level 5 caveat applies.  The history is provided by the patient. No language interpreter was used.      Past Medical History:  Diagnosis Date   Alcohol abuse    GERD (gastroesophageal reflux disease)    Gout    Homelessness    Hypertension     Patient Active Problem List   Diagnosis Date Noted   Syncope 05/24/2014   Alcohol abuse 04/01/2014   Viral URI 06/23/2013    Past Surgical History:  Procedure Laterality Date   CESAREAN SECTION  1997     OB History   No obstetric history on file.     No family history on file.  Social History   Tobacco Use   Smoking status: Every Day    Packs/day: 0.50    Years: 29.00    Pack years: 14.50    Types: Cigarettes   Smokeless tobacco: Never  Vaping Use   Vaping Use: Never used  Substance Use Topics   Alcohol use: Yes   Drug use: No    Home Medications Prior to Admission medications   Medication Sig Start Date End Date Taking? Authorizing Provider  albuterol (PROVENTIL HFA;VENTOLIN HFA) 108 (90 Base) MCG/ACT inhaler Inhale 1-2 puffs into the lungs every 6 (six) hours as needed for wheezing or shortness of breath. Patient not taking: Reported on  02/16/2021 09/26/18   Terrilee Files, MD  cephALEXin (KEFLEX) 500 MG capsule Take 1 capsule (500 mg total) by mouth 3 (three) times daily. Patient not taking: No sig reported 01/19/17   Roxy Horseman, PA-C  doxycycline (VIBRAMYCIN) 100 MG capsule Take 1 capsule (100 mg total) by mouth 2 (two) times daily. Patient not taking: No sig reported 09/26/18   Terrilee Files, MD  loperamide (IMODIUM) 2 MG capsule Take 1 capsule (2 mg total) by mouth once. Patient not taking: No sig reported 02/17/15   Earley Favor, NP  metoprolol (LOPRESSOR) 50 MG tablet Take 50 mg by mouth 2 (two) times daily. Patient not taking: Reported on 02/16/2021    [provider]  oxyCODONE-acetaminophen (PERCOCET/ROXICET) 5-325 MG per tablet Take 1-2 tablets by mouth every 4 (four) hours as needed for moderate pain or severe pain. Patient not taking: No sig reported 10/26/14   Oswaldo Conroy, PA-C  predniSONE (STERAPRED UNI-PAK 21 TAB) 10 MG (21) TBPK tablet Take by mouth daily. Take 6 tabs by mouth daily  for 2 days, then decrease by 1 tablet every 2 days Patient not taking: No sig reported 09/26/18   Terrilee Files, MD  traMADol (ULTRAM) 50 MG tablet Take 1 tablet (50 mg total) by mouth every 6 (six) hours  as needed. Patient not taking: No sig reported 02/17/15   Earley Favor, NP    Allergies    Ibuprofen, Meat extract, and Penicillins  Review of Systems   Review of Systems  Unable to perform ROS: Mental status change   Physical Exam Updated Vital Signs BP (!) 148/90 (BP Location: Left Arm)   Pulse 81   Resp 16   LMP 10/15/2013   SpO2 100%   Physical Exam Vitals and nursing note reviewed.  Constitutional:      General: She is not in acute distress.    Appearance: She is well-developed.     Comments: asleep  HENT:     Head: Normocephalic and atraumatic.  Eyes:     Conjunctiva/sclera: Conjunctivae normal.  Cardiovascular:     Rate and Rhythm: Normal rate and regular rhythm.     Heart sounds: No  murmur heard. Pulmonary:     Effort: Pulmonary effort is normal. No respiratory distress.     Breath sounds: Normal breath sounds.  Abdominal:     General: There is no distension.     Palpations: Abdomen is soft.  Musculoskeletal:     Cervical back: Neck supple.     Comments: No visible deformities  Skin:    General: Skin is warm and dry.  Neurological:     Comments: Unable to assess  Psychiatric:     Comments: Unable to assess    ED Results / Procedures / Treatments   Labs (all labs ordered are listed, but only abnormal results are displayed) Labs Reviewed  CBC WITH DIFFERENTIAL/PLATELET - Abnormal; Notable for the following components:      Result Value   RBC 5.15 (*)    MCV 77.1 (*)    MCH 24.7 (*)    All other components within normal limits  COMPREHENSIVE METABOLIC PANEL - Abnormal; Notable for the following components:   Calcium 8.8 (*)    All other components within normal limits  ETHANOL - Abnormal; Notable for the following components:   Alcohol, Ethyl (B) 254 (*)    All other components within normal limits  SALICYLATE LEVEL - Abnormal; Notable for the following components:   Salicylate Lvl <7.0 (*)    All other components within normal limits  ACETAMINOPHEN LEVEL - Abnormal; Notable for the following components:   Acetaminophen (Tylenol), Serum <10 (*)    All other components within normal limits  RAPID URINE DRUG SCREEN, HOSP PERFORMED  TROPONIN I (HIGH SENSITIVITY)    EKG EKG Interpretation  Date/Time:  Sunday May 13 2021 22:02:11 EDT Ventricular Rate:  79 PR Interval:  142 QRS Duration: 88 QT Interval:  388 QTC Calculation: 444 R Axis:   60 Text Interpretation: Normal sinus rhythm Possible Anterior infarct , age undetermined Abnormal ECG When compared with ECG of 10/28/2018, No significant change was found Confirmed by Dione Booze ([id number]) on 05/13/2021 11:46:51 PM  Radiology No results found.  Procedures Procedures   Medications Ordered in  ED Medications  sterile water (preservative free) injection (has no administration in time range)  ziprasidone (GEODON) injection 20 mg (20 mg Intramuscular Given 05/13/21 2233)    ED Course  I have reviewed the triage vital signs and the nursing notes.  Pertinent labs & imaging results that were available during my care of the patient were reviewed by me and considered in my medical decision making (see chart for details).    MDM Rules/Calculators/A&P  Patient reassessed.  She states that she originally wanted to be seen for chest pain, but now says that she is suicidal.  We will consult TTS for evaluation.  Patient is now alert and oriented, but is still sleepy.    Laboratory work-up is reassuring.  I do not feel that further emergent work-up is indicated.  No new ischemic EKG changes.  Doubt ACS.  TTS consult pending. Final Clinical Impression(s) / ED Diagnoses Final diagnoses:  Suicidal ideation    Rx / DC Orders ED Discharge Orders     None        Roxy Horseman, PA-C 05/14/21 0544    Dione Booze, MD 05/14/21 [phone]

## 2021-05-26 ENCOUNTER — Other Ambulatory Visit: Payer: Self-pay

## 2021-05-26 ENCOUNTER — Emergency Department (HOSPITAL_COMMUNITY)
Admission: EM | Admit: 2021-05-26 | Discharge: 2021-05-26 | Discharge status: Against Medical Advice | Payer: Medicaid Other | Attending: Emergency Medicine | Admitting: Emergency Medicine

## 2021-05-26 ENCOUNTER — Encounter (HOSPITAL_COMMUNITY): Payer: Self-pay | Admitting: Emergency Medicine

## 2021-05-26 DIAGNOSIS — Z5321 Procedure and treatment not carried out due to patient leaving prior to being seen by health care provider: Secondary | ICD-10-CM | POA: Insufficient documentation

## 2021-05-26 DIAGNOSIS — R0781 Pleurodynia: Secondary | ICD-10-CM | POA: Insufficient documentation

## 2021-05-26 NOTE — ED Triage Notes (Signed)
Patient here from home via EMS reporting left rib pain for 4 weeks. Seen at [facility] for same recently. States that pain is increased when touched or lifting arm. Also reports "its been years since I have taken BP meds".

## 2021-06-22 ENCOUNTER — Emergency Department (HOSPITAL_COMMUNITY)
Admission: EM | Admit: 2021-06-22 | Discharge: 2021-06-22 | Disposition: A | Discharge status: Home / Self Care | Payer: Medicaid Other | Attending: Emergency Medicine | Admitting: Emergency Medicine

## 2021-06-22 ENCOUNTER — Encounter (HOSPITAL_COMMUNITY): Payer: Self-pay

## 2021-06-22 ENCOUNTER — Emergency Department (HOSPITAL_COMMUNITY): Payer: Medicaid Other

## 2021-06-22 ENCOUNTER — Other Ambulatory Visit: Payer: Self-pay

## 2021-06-22 DIAGNOSIS — R1900 Intra-abdominal and pelvic swelling, mass and lump, unspecified site: Secondary | ICD-10-CM | POA: Diagnosis not present

## 2021-06-22 DIAGNOSIS — R197 Diarrhea, unspecified: Secondary | ICD-10-CM | POA: Insufficient documentation

## 2021-06-22 DIAGNOSIS — R112 Nausea with vomiting, unspecified: Secondary | ICD-10-CM | POA: Insufficient documentation

## 2021-06-22 DIAGNOSIS — Z5321 Procedure and treatment not carried out due to patient leaving prior to being seen by health care provider: Secondary | ICD-10-CM | POA: Insufficient documentation

## 2021-06-22 DIAGNOSIS — R109 Unspecified abdominal pain: Secondary | ICD-10-CM | POA: Insufficient documentation

## 2021-06-22 LAB — COMPREHENSIVE METABOLIC PANEL
ALT: 44 U/L (ref 0–44)
AST: 63 U/L — ABNORMAL HIGH (ref 15–41)
Albumin: 3.6 g/dL (ref 3.5–5.0)
Alkaline Phosphatase: 96 U/L (ref 38–126)
Anion gap: 11 (ref 5–15)
BUN: 13 mg/dL (ref 6–20)
CO2: 25 mmol/L (ref 22–32)
Calcium: 8.8 mg/dL — ABNORMAL LOW (ref 8.9–10.3)
Chloride: 105 mmol/L (ref 98–111)
Creatinine, Ser: 0.95 mg/dL (ref 0.44–1.00)
GFR, Estimated: >60 mL/min (ref >60)
Glucose, Bld: 87 mg/dL (ref 70–99)
Potassium: 4 mmol/L (ref 3.5–5.1)
Sodium: 141 mmol/L (ref 135–145)
Total Bilirubin: 0.5 mg/dL (ref 0.3–1.2)
Total Protein: 6.6 g/dL (ref 6.5–8.1)

## 2021-06-22 LAB — CBC
HCT: 40.5 % (ref 36.0–46.0)
Hemoglobin: 12.5 g/dL (ref 12.0–15.0)
MCH: 24.4 pg — ABNORMAL LOW (ref 26.0–34.0)
MCHC: 30.9 g/dL (ref 30.0–36.0)
MCV: 78.9 fL — ABNORMAL LOW (ref 80.0–100.0)
Platelets: 196 10*3/uL (ref 150–400)
RBC: 5.13 MIL/uL — ABNORMAL HIGH (ref 3.87–5.11)
RDW: 15.1 % (ref 11.5–15.5)
WBC: 4.8 10*3/uL (ref 4.0–10.5)
nRBC: 0 % (ref 0.0–0.2)

## 2021-06-22 LAB — LIPASE, BLOOD: Lipase: 27 U/L (ref 11–51)

## 2021-06-22 LAB — I-STAT BETA HCG BLOOD, ED (MC, WL, AP ONLY): I-stat hCG, quantitative: <5 m[IU]/mL (ref <5)

## 2021-06-22 NOTE — ED Provider Notes (Signed)
Emergency Medicine Provider Triage Evaluation Note  Amy Conway , a 55 y.o. female  was evaluated in triage.  Pt complains of abdominal pain x 2 days, primarily LLQ, constant, no alleviating/aggravating factors, having associated N/V/D. No recent foreign travel or abx.  Review of Systems  Positive: N/v/d, abdominal pain Negative: Fever, melena  Physical Exam  BP (!) 170/97   Pulse 79   Temp 97.6 F (36.4 C) (Oral)   Resp 18   Ht 5\' 1"  (1.549 m)   Wt 106.6 kg   LMP 10/15/2013   SpO2 100%   BMI 44.40 kg/m  Gen:   Awake, no distress   Resp:  Normal effort  MSK:   Moves extremities without difficulty  Other:  Generalized abdominal tenderness w/ increased focality in the LLQ  Medical Decision Making  Medically screening exam initiated at 4:15 AM.  Appropriate orders placed.  Amy Conway was informed that the remainder of the evaluation will be completed by another provider, this initial triage assessment does not replace that evaluation, and the importance of remaining in the ED until their evaluation is complete.  Abdominal pain   Rob Bunting, PA-C 06/22/21 0416    08/22/21, MD 06/22/21 [phone]

## 2021-06-22 NOTE — ED Notes (Signed)
Pt name called again for updated vitals, no response  

## 2021-06-22 NOTE — ED Notes (Signed)
Pt here via [facility] because she is cold.

## 2021-06-22 NOTE — ED Triage Notes (Signed)
Pt reports left sided abdominal pain and swelling x 2 days associated with n/v/d.

## 2021-06-22 NOTE — ED Notes (Signed)
Pt called numerous times and no answer. 

## 2021-09-01 ENCOUNTER — Other Ambulatory Visit: Payer: Self-pay

## 2021-09-01 ENCOUNTER — Emergency Department (HOSPITAL_COMMUNITY): Payer: Medicaid Other

## 2021-09-01 ENCOUNTER — Emergency Department (HOSPITAL_COMMUNITY)
Admission: EM | Admit: 2021-09-01 | Discharge: 2021-09-01 | Disposition: A | Discharge status: Home / Self Care | Payer: Medicaid Other | Attending: Emergency Medicine | Admitting: Emergency Medicine

## 2021-09-01 ENCOUNTER — Encounter (HOSPITAL_COMMUNITY): Payer: Self-pay | Admitting: Emergency Medicine

## 2021-09-01 DIAGNOSIS — K5792 Diverticulitis of intestine, part unspecified, without perforation or abscess without bleeding: Secondary | ICD-10-CM | POA: Diagnosis not present

## 2021-09-01 DIAGNOSIS — F17210 Nicotine dependence, cigarettes, uncomplicated: Secondary | ICD-10-CM | POA: Diagnosis not present

## 2021-09-01 DIAGNOSIS — Z79899 Other long term (current) drug therapy: Secondary | ICD-10-CM | POA: Insufficient documentation

## 2021-09-01 DIAGNOSIS — Z91040 Latex allergy status: Secondary | ICD-10-CM | POA: Insufficient documentation

## 2021-09-01 DIAGNOSIS — I10 Essential (primary) hypertension: Secondary | ICD-10-CM | POA: Diagnosis not present

## 2021-09-01 DIAGNOSIS — Z7982 Long term (current) use of aspirin: Secondary | ICD-10-CM | POA: Insufficient documentation

## 2021-09-01 DIAGNOSIS — R1084 Generalized abdominal pain: Secondary | ICD-10-CM | POA: Diagnosis present

## 2021-09-01 DIAGNOSIS — Z20822 Contact with and (suspected) exposure to covid-19: Secondary | ICD-10-CM | POA: Insufficient documentation

## 2021-09-01 HISTORY — DX: Depression, unspecified: F32.A

## 2021-09-01 LAB — COMPREHENSIVE METABOLIC PANEL
ALT: 24 U/L (ref 0–44)
AST: 32 U/L (ref 15–41)
Albumin: 3.9 g/dL (ref 3.5–5.0)
Alkaline Phosphatase: 82 U/L (ref 38–126)
Anion gap: 12 (ref 5–15)
BUN: 16 mg/dL (ref 6–20)
CO2: 26 mmol/L (ref 22–32)
Calcium: 8.6 mg/dL — ABNORMAL LOW (ref 8.9–10.3)
Chloride: 102 mmol/L (ref 98–111)
Creatinine, Ser: 0.8 mg/dL (ref 0.44–1.00)
GFR, Estimated: >60 mL/min (ref >60)
Glucose, Bld: 90 mg/dL (ref 70–99)
Potassium: 4.2 mmol/L (ref 3.5–5.1)
Sodium: 140 mmol/L (ref 135–145)
Total Bilirubin: 0.6 mg/dL (ref 0.3–1.2)
Total Protein: 7.4 g/dL (ref 6.5–8.1)

## 2021-09-01 LAB — URINALYSIS, MICROSCOPIC (REFLEX): Bacteria, UA: NONE SEEN

## 2021-09-01 LAB — URINALYSIS, ROUTINE W REFLEX MICROSCOPIC
Bilirubin Urine: NEGATIVE
Glucose, UA: NEGATIVE mg/dL
Ketones, ur: NEGATIVE mg/dL
Nitrite: NEGATIVE
Protein, ur: NEGATIVE mg/dL
Specific Gravity, Urine: 1.015 (ref 1.005–1.030)
pH: 6 (ref 5.0–8.0)

## 2021-09-01 LAB — RESP PANEL BY RT-PCR (FLU A&B, COVID) ARPGX2
Influenza A by PCR: NEGATIVE
Influenza B by PCR: NEGATIVE
SARS Coronavirus 2 by RT PCR: NEGATIVE

## 2021-09-01 LAB — CBC
HCT: 44.9 % (ref 36.0–46.0)
Hemoglobin: 13.7 g/dL (ref 12.0–15.0)
MCH: 24.1 pg — ABNORMAL LOW (ref 26.0–34.0)
MCHC: 30.5 g/dL (ref 30.0–36.0)
MCV: 79 fL — ABNORMAL LOW (ref 80.0–100.0)
Platelets: 228 10*3/uL (ref 150–400)
RBC: 5.68 MIL/uL — ABNORMAL HIGH (ref 3.87–5.11)
RDW: 15.2 % (ref 11.5–15.5)
WBC: 5.4 10*3/uL (ref 4.0–10.5)
nRBC: 0 % (ref 0.0–0.2)

## 2021-09-01 LAB — I-STAT BETA HCG BLOOD, ED (MC, WL, AP ONLY): I-stat hCG, quantitative: <5 m[IU]/mL (ref <5)

## 2021-09-01 LAB — LIPASE, BLOOD: Lipase: 23 U/L (ref 11–51)

## 2021-09-01 MED ORDER — IOHEXOL 300 MG/ML  SOLN
100.0000 mL | Freq: Once | INTRAMUSCULAR | Status: AC | PRN
  Administered 2021-09-01: 100 mL via INTRAVENOUS

## 2021-09-01 MED ORDER — CIPROFLOXACIN HCL 500 MG PO TABS
500.0000 mg | ORAL_TABLET | Freq: Once | ORAL | Status: AC
  Administered 2021-09-01: 500 mg via ORAL
  Filled 2021-09-01: qty 1

## 2021-09-01 MED ORDER — ONDANSETRON 4 MG PO TBDP
4.0000 mg | ORAL_TABLET | Freq: Once | ORAL | Status: DC | PRN
  Filled 2021-09-01: qty 1

## 2021-09-01 MED ORDER — METRONIDAZOLE 500 MG PO TABS
500.0000 mg | ORAL_TABLET | Freq: Once | ORAL | Status: AC
  Administered 2021-09-01: 500 mg via ORAL
  Filled 2021-09-01: qty 1

## 2021-09-01 MED ORDER — IOHEXOL 300 MG/ML  SOLN
75.0000 mL | Freq: Once | INTRAMUSCULAR | Status: AC | PRN
  Administered 2021-09-01: 75 mL via INTRAVENOUS

## 2021-09-01 MED ORDER — METRONIDAZOLE 500 MG PO TABS: 500.0000 mg | ORAL_TABLET | Freq: Two times a day (BID) | ORAL | 0 refills | Status: AC

## 2021-09-01 MED ORDER — CIPROFLOXACIN HCL 500 MG PO TABS: 500.0000 mg | ORAL_TABLET | Freq: Two times a day (BID) | ORAL | 0 refills | Status: DC

## 2021-09-01 MED ORDER — ONDANSETRON 4 MG PO TBDP: 8.0000 mg | ORAL_TABLET | Freq: Once | ORAL | Status: DC

## 2021-09-01 NOTE — ED Provider Notes (Addendum)
Emergency Medicine Provider Triage Evaluation Note  Amy Conway , a 55 y.o. female  was evaluated in triage.  Pt complains of nausea, vomiting, and diarrhea, headache for 3-4 days.  Does not know if she has a fever.  Patient states she has been having some spotting in her underwear although no period for many years.  Last emesis about one hour- green , no blood  Review of Systems  Positive: Chills, rhinorrhea , congestion, sore throat,  Negative: Cough,no pain  Physical Exam  BP (!) 177/106 (BP Location: Right Arm)    Pulse 91    Temp 97.9 F (36.6 C) (Oral)    Resp 20    LMP 10/15/2013    SpO2 100%  Gen:   Awake, no distress   Resp:  Normal effort  MSK:   Moves extremities without difficulty  Other:  Abdomen soft diffuse ttp  Medical Decision Making  Medically screening exam initiated at 7:18 AM.  Appropriate orders placed.  GERI HEPLER was informed that the remainder of the evaluation will be completed by another provider, this initial triage assessment does not replace that evaluation, and the importance of remaining in the ED until their evaluation is complete.  Covid with flu Cbc Cmet Urinalysis cxr   Margarita Grizzle, MD 09/01/21 [id number]    Margarita Grizzle, MD 09/01/21 [phone]

## 2021-09-01 NOTE — ED Provider Notes (Signed)
[facility] EMERGENCY DEPARTMENT Provider Note   CSN: [id number] Arrival date & time: 09/01/21  0701     History Chief Complaint  Patient presents with   Abdominal Pain    Amy Conway is a 55 y.o. female.  HPI 54 year old female history of GERD, depression, homelessness, hypertension, EtOH use, presents today complaining of nausea, vomiting, with some diarrhea over the past several days.  She feels that she has had chills but does not think that she has had a fever.  She is reports that she has had some green emesis has not noted any blood.  She denies abdominal pain.  States she has had some vaginal spotting.  She denies urinary tract infection symptoms.  She has 1-2 loose stools without blood.  She has had no known sick contacts.    Past Medical History:  Diagnosis Date   Alcohol abuse    Depression    GERD (gastroesophageal reflux disease)    Gout    Homelessness    Hypertension     Patient Active Problem List   Diagnosis Date Noted   Syncope 05/24/2014   Alcohol abuse 04/01/2014   Viral URI 06/23/2013    Past Surgical History:  Procedure Laterality Date   CESAREAN SECTION  1997     OB History   No obstetric history on file.     History reviewed. No pertinent family history.  Social History   Tobacco Use   Smoking status: Every Day    Packs/day: 0.50    Years: 29.00    Pack years: 14.50    Types: Cigarettes   Smokeless tobacco: Never  Vaping Use   Vaping Use: Never used  Substance Use Topics   Alcohol use: Yes   Drug use: No    Home Medications Prior to Admission medications   Medication Sig Start Date End Date Taking? Authorizing Provider  ciprofloxacin (CIPRO) 500 MG tablet Take 1 tablet (500 mg total) by mouth every 12 (twelve) hours. 09/01/21  Yes Margarita Grizzle, MD  metroNIDAZOLE (FLAGYL) 500 MG tablet Take 1 tablet (500 mg total) by mouth 2 (two) times daily. 09/01/21  Yes Margarita Grizzle, MD  acetaminophen (TYLENOL)  500 MG tablet Take 500-1,000 mg by mouth every 6 (six) hours as needed (for headaches or mild pain).    [provider]  albuterol (PROVENTIL HFA;VENTOLIN HFA) 108 (90 Base) MCG/ACT inhaler Inhale 1-2 puffs into the lungs every 6 (six) hours as needed for wheezing or shortness of breath. Patient not taking: No sig reported 09/26/18   Terrilee Files, MD  ALEVE 220 MG tablet Take 220-440 mg by mouth 2 (two) times daily as needed (for headaches).    [provider]  Aspirin-Acetaminophen-Caffeine (GOODY HEADACHE PO) Take 1 packet by mouth 2 (two) times daily as needed (for headaches).    [provider]  Aspirin-Salicylamide-Caffeine (BC HEADACHE POWDER PO) Take 1 packet by mouth 2 (two) times daily as needed (for headaches).    [provider]  loperamide (IMODIUM) 2 MG capsule Take 1 capsule (2 mg total) by mouth once. Patient not taking: No sig reported 02/17/15   Earley Favor, NP  metoprolol (LOPRESSOR) 50 MG tablet Take 50 mg by mouth 2 (two) times daily. Patient not taking: No sig reported    [provider]  oxyCODONE-acetaminophen (PERCOCET/ROXICET) 5-325 MG per tablet Take 1-2 tablets by mouth every 4 (four) hours as needed for moderate pain or severe pain. Patient not taking: Reported on 05/14/2021  10/26/14   Oswaldo Conroy, PA-C  traMADol (ULTRAM) 50 MG tablet Take 1 tablet (50 mg total) by mouth every 6 (six) hours as needed. Patient not taking: No sig reported 02/17/15   Earley Favor, NP    Allergies    Ibuprofen, Meat extract, Penicillins, and Latex  Review of Systems   Review of Systems  All other systems reviewed and are negative.  Physical Exam Updated Vital Signs BP (!) 156/100 (BP Location: Right Arm)    Pulse 79    Temp 98.7 F (37.1 C) (Oral)    Resp 16    LMP 10/15/2013    SpO2 100%   Physical Exam Vitals and nursing note reviewed.  Constitutional:      General: She is not in acute distress.    Appearance: She is  well-developed.  HENT:     Head: Normocephalic and atraumatic.     Right Ear: External ear normal.     Left Ear: External ear normal.     Nose: Nose normal.  Eyes:     Conjunctiva/sclera: Conjunctivae normal.     Pupils: Pupils are equal, round, and reactive to light.  Pulmonary:     Effort: Pulmonary effort is normal.  Abdominal:     General: Abdomen is flat. Bowel sounds are normal.     Palpations: Abdomen is soft.     Comments: Mild diffuse tenderness to palpation  Musculoskeletal:        General: Normal range of motion.     Cervical back: Normal range of motion and neck supple.  Skin:    General: Skin is warm and dry.  Neurological:     Mental Status: She is alert and oriented to person, place, and time.     Motor: No abnormal muscle tone.     Coordination: Coordination normal.  Psychiatric:        Behavior: Behavior normal.        Thought Content: Thought content normal.    ED Results / Procedures / Treatments   Labs (all labs ordered are listed, but only abnormal results are displayed) Labs Reviewed  COMPREHENSIVE METABOLIC PANEL - Abnormal; Notable for the following components:      Result Value   Calcium 8.6 (*)    All other components within normal limits  CBC - Abnormal; Notable for the following components:   RBC 5.68 (*)    MCV 79.0 (*)    MCH 24.1 (*)    All other components within normal limits  RESP PANEL BY RT-PCR (FLU A&B, COVID) ARPGX2  LIPASE, BLOOD  URINALYSIS, ROUTINE W REFLEX MICROSCOPIC  I-STAT BETA HCG BLOOD, ED ([facility], [facility], [facility] ONLY)    EKG None  Radiology DG Chest 2 View  Result Date: 09/01/2021 CLINICAL DATA:  Nausea and vomiting EXAM: CHEST - 2 VIEW COMPARISON:  10/28/2018 FINDINGS: Normal heart size and mediastinal contours. No acute infiltrate or edema. No effusion or pneumothorax. No acute osseous findings. IMPRESSION: Negative chest. Electronically Signed   By: Tiburcio Pea M.D.   On: 09/01/2021 08:25   CT ABDOMEN PELVIS W  CONTRAST  Result Date: 09/01/2021 CLINICAL DATA:  Acute non localized abdominal pain. EXAM: CT ABDOMEN AND PELVIS WITH CONTRAST TECHNIQUE: Multidetector CT imaging of the abdomen and pelvis was performed using the standard protocol following bolus administration of intravenous contrast. CONTRAST:  75mL OMNIPAQUE IOHEXOL 300 MG/ML SOLN, OMNIPAQUE IOHEXOL 300 MG/ML SOLN COMPARISON:  07/20/2019; pelvic ultrasound-10/14/2016 FINDINGS: Lower chest: Limited visualization of the lower thorax demonstrates  minimal bibasilar dependent subsegmental atelectasis. No discrete focal airspace opacities. No pleural effusion. Borderline cardiomegaly.  No pericardial effusion. Hepatobiliary: Normal hepatic contour. No discrete hepatic lesions. Normal appearance of the gallbladder given underdistention. No radiopaque gallstones. No intra or extrahepatic biliary duct dilatation. No ascites. Pancreas: Normal appearance of the pancreas. Spleen: Normal appearance of the spleen. Note is made of a small splenule about the inferior anterior tip of the spleen. Adrenals/Urinary Tract: There is symmetric enhancement and excretion of the bilateral kidneys. No evidence of nephrolithiasis on this postcontrast examination. 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 No urinary obstruction or perinephric stranding. Normal appearance the bilateral adrenal glands. Normal appearance of the urinary bladder given degree of distension. Stomach/Bowel: There is a very minimal amount of stranding adjacent to the posterior aspect of the mid descending colon (image 41, series 4), favored to represent a small area of acute uncomplicated diverticulitis, similar though significantly less severe compared to previous episode of diverticulitis in 07/2019. Ingested enteric contrast extends to the level of the mid small bowel. Large colonic stool burden within the rectal vault. No evidence of enteric obstruction. Normal appearance of the  terminal ileum and the appendix. No discrete areas of bowel wall thickening. No pneumoperitoneum, pneumatosis or portal venous gas. Vascular/Lymphatic: Scattered atherosclerotic plaque within a tortuous but normal caliber abdominal aorta. The major branch vessels of the abdominal aorta appear patent on this non CTA examination. No bulky retroperitoneal, mesenteric, pelvic or inguinal lymphadenopathy. Reproductive: Note is made of an approximately 2.4 x 2.4 cm fibroid within the right-side of the uterine fundus (image 59, series 5). Note is made of a approximately 1.3 x 0.9 cm macroscopic fat containing lesion within the left adnexa, potentially representative of a teratoma (axial image 59, series 5; coronal image 37, series 8), not seen on remote pelvic ultrasound performed 09/2016 though the left ovary was not directly visualized on that examination. Other: Minimal amount of subcutaneous edema about the midline of the low back. Musculoskeletal: No acute or aggressive osseous abnormalities. IMPRESSION: 1. Suspected acute, uncomplicated diverticulitis involving the mid aspect of the descending colon, similar though significantly less severe than previous episode of diverticulitis in 07/2019. No evidence of perforation or definable/drainable fluid collection. 2. Otherwise, no explanation for patient's abdominal pain. Specifically, no evidence of enteric or urinary obstruction. Normal appearance of the appendix. 3. Suspected fibroid uterus with incidental note made of an approximately 1.3 cm macroscopic fat containing left-sided adnexal lesion, potentially representative of a teratoma. Further evaluation with nonemergent pelvic MRI could be performed as indicated. 4.  Aortic Atherosclerosis (ICD10-I70.0). Electronically Signed   By: Simonne Come M.D.   On: 09/01/2021 11:35    Procedures Procedures   Medications Ordered in ED Medications  ondansetron (ZOFRAN-ODT) disintegrating tablet 4 mg (has no administration  in time range)  ciprofloxacin (CIPRO) tablet 500 mg (has no administration in time range)  metroNIDAZOLE (FLAGYL) tablet 500 mg (has no administration in time range)  iohexol (OMNIPAQUE) 300 MG/ML solution 100 mL (100 mLs Intravenous Contrast Given 09/01/21 1058)  iohexol (OMNIPAQUE) 300 MG/ML solution 75 mL (75 mLs Intravenous Contrast Given 09/01/21 1058)    ED Course  I have reviewed the triage vital signs and the nursing notes.  Pertinent labs & imaging results that were available during my care of the patient were reviewed by me and considered in my medical decision making (see chart for details).  Clinical Course as of 09/01/21 1230  Sat Sep 01, 2021  1109 CBC reviewed  with normal white blood cell count normal hemoglobin. Electrolytes within normal limits with mild hypocalcemia COVID, flu, negative. [DR]    Clinical Course User Index [DR] Margarita Grizzle, MD   MDM Rules/Calculators/A&P                         Patient presents with nausea, diarrhea, and nonspecific abdominal pain.  Labs are stable here.  Patient had CT which shows some early diverticulitis.  She is started on Cipro and Flagyl here.  She is currently homeless.  We are working with transitions of care to get medications filled and have her placed appropriately. She is advised of need for follow-up return precautions and voiced understanding. Final Clinical Impression(s) / ED Diagnoses Final diagnoses:  Diverticulitis    Rx / DC Orders ED Discharge Orders          Ordered    ciprofloxacin (CIPRO) 500 MG tablet  Every 12 hours        09/01/21 1230    metroNIDAZOLE (FLAGYL) 500 MG tablet  2 times daily        09/01/21 1230             Margarita Grizzle, MD 09/01/21 1230

## 2021-09-01 NOTE — ED Provider Notes (Signed)
Secondary to some high acuity here in the green pod patient ended up out in the triage area.  Now brought back.  Has some extravasation of IV contrast during her CT scan of her abdomen which was positive for diverticulitis.  Patient with some swelling at the antecubital area.  Still with good radial pulse.  Good movement of her fingers.  Patient warned that that some of that fluid may move down towards her hand and that may swell as well.  But otherwise nothing acute.  Patient stable for discharge home.  Patient was actually discharged it appears about 4 hours ago.  Patient was originally seen and evaluated by Dr. Rosalia Hammers.   Vanetta Mulders, MD 09/01/21 [phone]

## 2021-09-01 NOTE — ED Triage Notes (Signed)
C/o generalized abd pain, nausea, vomiting, and diarrhea since yesterday. 

## 2021-09-01 NOTE — ED Notes (Signed)
IV removed.

## 2021-09-01 NOTE — Discharge Instructions (Addendum)
If you are in need of a shelter please call [facility] ([facility]) at [phone] between the hours of 9am-5pm Mon-Fri.  [facility] will contact all the local shelters to find openings.  Right now they are requiring people to quarantine at a hotel before they can go to an open bed but [facility] may be able to set that up as well.  They are not open on weekends.  On Monday-Friday morning at 8 am until 1 pm, you can also go to the [employer] (see below) to seek shelter in the [facility] prior to entering a shelter. You can also call the number provided (see the above paragraph) to seek placement into the program by calling [facility].   Interactive resource center [facility]) [address] [place], [place] [address] Phone: [phone] Fax: [phone]  For Free Breakfasts and Lunches 7 days a week you can go to: [facility] [address] [place], [place], [place] [address] Hours: Open today  8AM-5PM Phone: [phone] Breakfast: 6:30-7:30 am Lunch served: 10:40 am - 12:40pm         [facility] SHELTER OPEN 12/17 AND 12/18 [facility]) [address] [place], [place] [address]  OPENS AT 7PM  DAY CENTERS [facility]) M-F 8am-3pm   [address] [place], [place] [address]   [phone] Services include: laundry, barbering, support groups, case management, phone  & computer access, showers, AA/NA mtgs, mental health/substance abuse nurse, job skills class, disability information, VA assistance, spiritual classes, etc.   [facility] [address] [place], [place]   [phone] Provides breakfast each weekday morning except Wednesdays, and an evening community meal every Friday. Access to showers is available during breakfast hours and telephones for seeking work are also provided. Also offers job referral and counseling for the homeless and unemployed.  HOMELESS  SHELTERS [facility]   [employer] [phone]     [facility] [address] [place], [place] [address]     [address], [place]  [phone]      [phone]  [facility]   [facility] [place] [address] [place]    [address] [place] [address]     [place], [place] [address] [phone]       [phone]  [facility] (women only) [address] [place], [place] [address] [phone]  [facility]: Marcy [facility] (overflow shelter: [address] [place], [place] check in at 6pm for placement in local shelter).  [address] [place], [place], [place] [address] Phone: [phone]  Crisis Services [facility]     [facility]   Crisis Services      [facility] [phone]. [address]     [address] [place], [place] [address]     [place], [place] [address]    [facility] - [phone]

## 2021-09-01 NOTE — Care Management (Signed)
Consult for medication assistance.Patient has Medicaid, so he is ineligible ble for [facility] program. Checked with social work for petty cash, there is none.

## 2021-09-01 NOTE — ED Notes (Signed)
Reviewed discharge instructions with patient. Follow-up care and medications reviewed. Patient  verbalized understanding. Patient A&Ox4, VSS, and ambulatory with steady gait upon discharge.  °

## 2021-09-13 ENCOUNTER — Emergency Department (HOSPITAL_COMMUNITY)
Admission: EM | Admit: 2021-09-13 | Discharge: 2021-09-14 | Disposition: A | Discharge status: Home / Self Care | Payer: Medicaid Other | Attending: Emergency Medicine | Admitting: Emergency Medicine

## 2021-09-13 ENCOUNTER — Encounter (HOSPITAL_COMMUNITY): Payer: Self-pay | Admitting: Emergency Medicine

## 2021-09-13 ENCOUNTER — Other Ambulatory Visit: Payer: Self-pay

## 2021-09-13 DIAGNOSIS — Z79899 Other long term (current) drug therapy: Secondary | ICD-10-CM | POA: Insufficient documentation

## 2021-09-13 DIAGNOSIS — I10 Essential (primary) hypertension: Secondary | ICD-10-CM | POA: Insufficient documentation

## 2021-09-13 DIAGNOSIS — Z91040 Latex allergy status: Secondary | ICD-10-CM | POA: Diagnosis not present

## 2021-09-13 DIAGNOSIS — Z7982 Long term (current) use of aspirin: Secondary | ICD-10-CM | POA: Diagnosis not present

## 2021-09-13 DIAGNOSIS — K5900 Constipation, unspecified: Secondary | ICD-10-CM | POA: Diagnosis not present

## 2021-09-13 DIAGNOSIS — R1032 Left lower quadrant pain: Secondary | ICD-10-CM | POA: Diagnosis present

## 2021-09-13 DIAGNOSIS — F17210 Nicotine dependence, cigarettes, uncomplicated: Secondary | ICD-10-CM | POA: Insufficient documentation

## 2021-09-13 DIAGNOSIS — Z20822 Contact with and (suspected) exposure to covid-19: Secondary | ICD-10-CM | POA: Diagnosis not present

## 2021-09-13 LAB — CBC WITH DIFFERENTIAL/PLATELET
Abs Immature Granulocytes: 0.01 10*3/uL (ref 0.00–0.07)
Basophils Absolute: 0 10*3/uL (ref 0.0–0.1)
Basophils Relative: 1 %
Eosinophils Absolute: 0.1 10*3/uL (ref 0.0–0.5)
Eosinophils Relative: 2 %
HCT: 42.5 % (ref 36.0–46.0)
Hemoglobin: 12.9 g/dL (ref 12.0–15.0)
Immature Granulocytes: 0 %
Lymphocytes Relative: 20 %
Lymphs Abs: 0.9 10*3/uL (ref 0.7–4.0)
MCH: 23.8 pg — ABNORMAL LOW (ref 26.0–34.0)
MCHC: 30.4 g/dL (ref 30.0–36.0)
MCV: 78.6 fL — ABNORMAL LOW (ref 80.0–100.0)
Monocytes Absolute: 0.5 10*3/uL (ref 0.1–1.0)
Monocytes Relative: 10 %
Neutro Abs: 3.2 10*3/uL (ref 1.7–7.7)
Neutrophils Relative %: 67 %
Platelets: 199 10*3/uL (ref 150–400)
RBC: 5.41 MIL/uL — ABNORMAL HIGH (ref 3.87–5.11)
RDW: 14.8 % (ref 11.5–15.5)
WBC: 4.7 10*3/uL (ref 4.0–10.5)
nRBC: 0 % (ref 0.0–0.2)

## 2021-09-13 LAB — COMPREHENSIVE METABOLIC PANEL
ALT: 25 U/L (ref 0–44)
AST: 42 U/L — ABNORMAL HIGH (ref 15–41)
Albumin: 3.8 g/dL (ref 3.5–5.0)
Alkaline Phosphatase: 79 U/L (ref 38–126)
Anion gap: 11 (ref 5–15)
BUN: 10 mg/dL (ref 6–20)
CO2: 23 mmol/L (ref 22–32)
Calcium: 8.7 mg/dL — ABNORMAL LOW (ref 8.9–10.3)
Chloride: 104 mmol/L (ref 98–111)
Creatinine, Ser: 0.89 mg/dL (ref 0.44–1.00)
GFR, Estimated: >60 mL/min (ref >60)
Glucose, Bld: 79 mg/dL (ref 70–99)
Potassium: 3.6 mmol/L (ref 3.5–5.1)
Sodium: 138 mmol/L (ref 135–145)
Total Bilirubin: 0.7 mg/dL (ref 0.3–1.2)
Total Protein: 7.2 g/dL (ref 6.5–8.1)

## 2021-09-13 LAB — LIPASE, BLOOD: Lipase: 29 U/L (ref 11–51)

## 2021-09-13 NOTE — ED Provider Notes (Signed)
Emergency Medicine Provider Triage Evaluation Note  Amy Conway , a 55 y.o. female  was evaluated in triage.  Pt complains of abdominal pain.  She was seen here 09/01/21 and diagnosed with diverticulitis.  States she has not been able to finish her cipro/flagyl as she continues vomiting and cannot have a BM-- some runny and bloody stool but no formed BM.  States pain is worse now.  Denies fever.  Review of Systems  Positive: Abdominal pain, vomiting Negative: fever  Physical Exam  BP (!) 154/85    Pulse (!) 101    Temp 98.4 F (36.9 C) (Oral)    Resp 16    LMP 10/15/2013    SpO2 99%  Gen:   Awake, no distress   Resp:  Normal effort  MSK:   Moves extremities without difficulty  Other:  Tender throughout lower abdomen, right > left  Medical Decision Making  Medically screening exam initiated at 11:03 PM.  Appropriate orders placed.  SHONIA SKILLING was informed that the remainder of the evaluation will be completed by another provider, this initial triage assessment does not replace that evaluation, and the importance of remaining in the ED until their evaluation is complete.  Abdominal pain.  Recent diverticulitis, has not been able to finish all her abx yet.  Pain is worsening, some runny/bloody stool but no formed BM.  Will check labs, CT scan to r/o complications such as perforation or abscess.  Will also add on c.diff studies.   Garlon Hatchet, PA-C 09/26/2021 2307    Gilda Crease, MD 09/14/21 [phone]

## 2021-09-13 NOTE — ED Triage Notes (Signed)
Patient reports right abdominal pain with emesis , constipation and diarrhea this week , currently taking Flagyl /Cipro oral antibiotic .

## 2021-09-14 ENCOUNTER — Encounter (HOSPITAL_COMMUNITY): Payer: Self-pay

## 2021-09-14 ENCOUNTER — Emergency Department (HOSPITAL_COMMUNITY): Payer: Medicaid Other

## 2021-09-14 LAB — URINALYSIS, ROUTINE W REFLEX MICROSCOPIC
Bacteria, UA: NONE SEEN
Bilirubin Urine: NEGATIVE
Glucose, UA: NEGATIVE mg/dL
Ketones, ur: NEGATIVE mg/dL
Leukocytes,Ua: NEGATIVE
Nitrite: NEGATIVE
Protein, ur: NEGATIVE mg/dL
Specific Gravity, Urine: 1.023 (ref 1.005–1.030)
pH: 5 (ref 5.0–8.0)

## 2021-09-14 LAB — RESP PANEL BY RT-PCR (FLU A&B, COVID) ARPGX2
Influenza A by PCR: NEGATIVE
Influenza B by PCR: NEGATIVE
SARS Coronavirus 2 by RT PCR: POSITIVE — AB

## 2021-09-14 MED ORDER — ACETAMINOPHEN 500 MG PO TABS
1000.0000 mg | ORAL_TABLET | Freq: Once | ORAL | Status: AC
  Administered 2021-09-14: 09:00:00 1000 mg via ORAL
  Filled 2021-09-14: qty 2

## 2021-09-14 MED ORDER — FLEET ENEMA 7-19 GM/118ML RE ENEM: 1.0000 | ENEMA | Freq: Once | RECTAL | 1 refills | Status: AC

## 2021-09-14 MED ORDER — IOHEXOL 350 MG/ML SOLN
80.0000 mL | Freq: Once | INTRAVENOUS | Status: AC | PRN
  Administered 2021-09-14: 01:00:00 80 mL via INTRAVENOUS

## 2021-09-14 MED ORDER — POLYETHYLENE GLYCOL 3350 17 GM/SCOOP PO POWD: 17.0000 g | Freq: Every day | ORAL | 0 refills | Status: AC

## 2021-09-14 NOTE — ED Provider Notes (Signed)
[facility] EMERGENCY DEPARTMENT Provider Note   CSN: [id number] Arrival date & time: 09/13/21  2243     History Chief Complaint  Patient presents with   Abdominal Pain    Constipation     Amy Conway is a 55 y.o. female.  The history is provided by the patient and medical records. No language interpreter was used.  Abdominal Pain  55 year old female significant history of homelessness, alcohol abuse, hypertension, GERD, presenting for evaluation of constipation.  Patient was last seen in the ED on 09/01/2021 for abdominal pain.  At that time she was diagnosed with diverticulitis.  She was prescribed Cipro and Flagyl.  Patient states she has been taking the medication.  Initially she was having diarrhea but for the past 5 days she report feeling constipated and having had a full bowel movement.  She mentioned that her abdominal pain did seems to be improving.  She denies nausea or vomiting.  No dysuria.  She is able to pass flatus.  No report of runny nose sneezing or coughing at this time.  Abdominal pain is mild, achy, lower abdomen.  Past Medical History:  Diagnosis Date   Alcohol abuse    Depression    GERD (gastroesophageal reflux disease)    Gout    Homelessness    Hypertension     Patient Active Problem List   Diagnosis Date Noted   Syncope 05/24/2014   Alcohol abuse 04/01/2014   Viral URI 06/23/2013    Past Surgical History:  Procedure Laterality Date   CESAREAN SECTION  1997     OB History   No obstetric history on file.     No family history on file.  Social History   Tobacco Use   Smoking status: Every Day    Packs/day: 0.50    Years: 29.00    Pack years: 14.50    Types: Cigarettes   Smokeless tobacco: Never  Vaping Use   Vaping Use: Never used  Substance Use Topics   Alcohol use: Yes   Drug use: No    Home Medications Prior to Admission medications   Medication Sig Start Date End Date Taking? Authorizing Provider   acetaminophen (TYLENOL) 500 MG tablet Take 500-1,000 mg by mouth every 6 (six) hours as needed (for headaches or mild pain).    [provider]  albuterol (PROVENTIL HFA;VENTOLIN HFA) 108 (90 Base) MCG/ACT inhaler Inhale 1-2 puffs into the lungs every 6 (six) hours as needed for wheezing or shortness of breath. Patient not taking: No sig reported 09/26/18   Terrilee Files, MD  ALEVE 220 MG tablet Take 220-440 mg by mouth 2 (two) times daily as needed (for headaches).    [provider]  Aspirin-Acetaminophen-Caffeine (GOODY HEADACHE PO) Take 1 packet by mouth 2 (two) times daily as needed (for headaches).    [provider]  Aspirin-Salicylamide-Caffeine (BC HEADACHE POWDER PO) Take 1 packet by mouth 2 (two) times daily as needed (for headaches).    [provider]  ciprofloxacin (CIPRO) 500 MG tablet Take 1 tablet (500 mg total) by mouth every 12 (twelve) hours. 09/01/21   Margarita Grizzle, MD  loperamide (IMODIUM) 2 MG capsule Take 1 capsule (2 mg total) by mouth once. Patient not taking: No sig reported 02/17/15   Earley Favor, NP  metoprolol (LOPRESSOR) 50 MG tablet Take 50 mg by mouth 2 (two) times daily. Patient not taking: No sig reported    [provider]  metroNIDAZOLE (FLAGYL) 500  MG tablet Take 1 tablet (500 mg total) by mouth 2 (two) times daily. 09/01/21   Margarita Grizzle, MD  oxyCODONE-acetaminophen (PERCOCET/ROXICET) 5-325 MG per tablet Take 1-2 tablets by mouth every 4 (four) hours as needed for moderate pain or severe pain. Patient not taking: Reported on 05/14/2021 10/26/14   Oswaldo Conroy, PA-C  traMADol (ULTRAM) 50 MG tablet Take 1 tablet (50 mg total) by mouth every 6 (six) hours as needed. Patient not taking: No sig reported 02/17/15   Earley Favor, NP    Allergies    Ibuprofen, Meat extract, Penicillins, and Latex  Review of Systems   Review of Systems  Gastrointestinal:  Positive for abdominal pain.  All other systems reviewed  and are negative.  Physical Exam Updated Vital Signs BP (!) 152/95 (BP Location: Right Arm)    Pulse 99    Temp 100.1 F (37.8 C) (Oral)    Resp 17    Ht 5\' 1"  (1.549 m)    Wt 85 kg    LMP 10/15/2013    SpO2 100%    BMI 35.41 kg/m   Physical Exam Vitals and nursing note reviewed.  Constitutional:      General: She is not in acute distress.    Appearance: She is well-developed.  HENT:     Head: Atraumatic.  Eyes:     Conjunctiva/sclera: Conjunctivae normal.  Cardiovascular:     Rate and Rhythm: Normal rate and regular rhythm.     Heart sounds: Normal heart sounds.  Pulmonary:     Effort: Pulmonary effort is normal.  Abdominal:     Palpations: Abdomen is soft.     Tenderness: There is abdominal tenderness (Mild tenderness to left lower abdomen no guarding or rebound tenderness) in the left lower quadrant.  Genitourinary:    Comments: Chaperone present during exam, attempt to perform digital rectal rectal exam however patient then declined exam. Musculoskeletal:     Cervical back: Neck supple.  Skin:    Findings: No rash.  Neurological:     Mental Status: She is alert.  Psychiatric:        Mood and Affect: Mood normal.    ED Results / Procedures / Treatments   Labs (all labs ordered are listed, but only abnormal results are displayed) Labs Reviewed  CBC WITH DIFFERENTIAL/PLATELET - Abnormal; Notable for the following components:      Result Value   RBC 5.41 (*)    MCV 78.6 (*)    MCH 23.8 (*)    All other components within normal limits  COMPREHENSIVE METABOLIC PANEL - Abnormal; Notable for the following components:   Calcium 8.7 (*)    AST 42 (*)    All other components within normal limits  URINALYSIS, ROUTINE W REFLEX MICROSCOPIC - Abnormal; Notable for the following components:   APPearance HAZY (*)    Hgb urine dipstick SMALL (*)    All other components within normal limits  C DIFFICILE QUICK SCREEN W PCR REFLEX    RESP PANEL BY RT-PCR (FLU A&B, COVID) [id number]   LIPASE, BLOOD    EKG None  Radiology CT ABDOMEN PELVIS W CONTRAST  Result Date: 09/14/2021 CLINICAL DATA:  Left lower quadrant abdominal pain. EXAM: CT ABDOMEN AND PELVIS WITH CONTRAST TECHNIQUE: Multidetector CT imaging of the abdomen and pelvis was performed using the standard protocol following bolus administration of intravenous contrast. CONTRAST:  24mL OMNIPAQUE IOHEXOL 350 MG/ML SOLN COMPARISON:  CT abdomen pelvis dated 09/01/2021. FINDINGS: Lower chest: The visualized lung bases  are clear. No intra-abdominal free air or free fluid. Hepatobiliary: No focal liver abnormality is seen. No gallstones, gallbladder wall thickening, or biliary dilatation. Pancreas: Unremarkable. No pancreatic ductal dilatation or surrounding inflammatory changes. Spleen: Normal in size without focal abnormality. Adrenals/Urinary Tract: Adrenal glands are unremarkable. Kidneys are normal, without renal calculi, focal lesion, or hydronephrosis. Bladder is unremarkable. Stomach/Bowel: There is a moderate-sized stool in the rectal vault. Several scattered sigmoid diverticula without active inflammatory changes. There is no bowel obstruction or active inflammation. The appendix is normal. Vascular/Lymphatic: Mild aortoiliac atherosclerotic disease. The IVC is unremarkable. No portal venous gas. There is no adenopathy. Reproductive: The uterus is anteverted and grossly unremarkable. No adnexal masses. Other: None Musculoskeletal: No acute or significant osseous findings. IMPRESSION: 1. No acute intra-abdominal or pelvic pathology. 2. Sigmoid diverticulosis.  No bowel obstruction. Normal appendix. 3. Aortic Atherosclerosis (ICD10-I70.0). Electronically Signed   By: Elgie Collard M.D.   On: 09/14/2021 00:46    Procedures Procedures   Medications Ordered in ED Medications  iohexol (OMNIPAQUE) 350 MG/ML injection 80 mL (80 mLs Intravenous Contrast Given 09/14/21 0035)  acetaminophen (TYLENOL) tablet 1,000 mg (1,000 mg  Oral Given 09/14/21 0109)    ED Course  I have reviewed the triage vital signs and the nursing notes.  Pertinent labs & imaging results that were available during my care of the patient were reviewed by me and considered in my medical decision making (see chart for details).    MDM Rules/Calculators/A&P                         BP (!) 152/95 (BP Location: Right Arm)    Pulse 99    Temp 100.1 F (37.8 C) (Oral)    Resp 17    Ht 5\' 1"  (1.549 m)    Wt 85 kg    LMP 10/15/2013    SpO2 100%    BMI 35.41 kg/m      Final Clinical Impression(s) / ED Diagnoses Final diagnoses:  Constipation, unspecified constipation type    Rx / DC Orders ED Discharge Orders          Ordered    polyethylene glycol powder (GLYCOLAX/MIRALAX) 17 GM/SCOOP powder  Daily        09/14/21 0919    sodium phosphate (FLEET) 7-19 GM/118ML ENEM   Once        09/14/21 0919           8:23 AM Patient previously diagnosed with diverticulitis and was started on Cipro Flagyl.  She is here with complaints of constipation for the past 5 days.  CT scan obtained today did shows a moderate sized stool in the rectal vault and several scattered sigmoid diverticula without any active inflammatory changes.  No other acute intra-abdominal or pelvic pathology.  Her labs are reassuring, normal UA.  I did offer to perform digital rectal exam and likely manual disimpaction however patient declined.  Plan to discharge patient home with MiraLAX as well as Fleet enema for further managements of constipation.  She initially did have a low-grade fever, recommend Tylenol, no other concerning symptoms noted.  She is stable for discharge, return precaution given.  Since patient does have a low-grade temperature, will obtain viral respiratory panel.  Patient can follow result through MyChart.  Tylenol given for fever.   09/16/21, PA-C 09/14/21 09/16/21    3235, MD 09/15/21 [phone]

## 2021-11-20 ENCOUNTER — Emergency Department (HOSPITAL_COMMUNITY): Payer: Medicaid Other

## 2021-11-20 ENCOUNTER — Emergency Department (HOSPITAL_COMMUNITY)
Admission: EM | Admit: 2021-11-20 | Discharge: 2021-11-20 | Disposition: A | Discharge status: Home / Self Care | Payer: Medicaid Other | Attending: Emergency Medicine | Admitting: Emergency Medicine

## 2021-11-20 ENCOUNTER — Other Ambulatory Visit: Payer: Self-pay

## 2021-11-20 ENCOUNTER — Encounter (HOSPITAL_COMMUNITY): Payer: Self-pay

## 2021-11-20 DIAGNOSIS — I10 Essential (primary) hypertension: Secondary | ICD-10-CM | POA: Diagnosis not present

## 2021-11-20 DIAGNOSIS — M25511 Pain in right shoulder: Secondary | ICD-10-CM | POA: Insufficient documentation

## 2021-11-20 DIAGNOSIS — Z7982 Long term (current) use of aspirin: Secondary | ICD-10-CM | POA: Insufficient documentation

## 2021-11-20 DIAGNOSIS — Z91040 Latex allergy status: Secondary | ICD-10-CM | POA: Diagnosis not present

## 2021-11-20 MED ORDER — METOPROLOL TARTRATE 25 MG PO TABS
50.0000 mg | ORAL_TABLET | Freq: Once | ORAL | Status: AC
  Administered 2021-11-20: 50 mg via ORAL
  Filled 2021-11-20: qty 2

## 2021-11-20 MED ORDER — MELOXICAM 15 MG PO TABS: 15.0000 mg | ORAL_TABLET | Freq: Every day | ORAL | 2 refills | Status: AC

## 2021-11-20 MED ORDER — METOPROLOL TARTRATE 50 MG PO TABS: 50.0000 mg | ORAL_TABLET | Freq: Two times a day (BID) | ORAL | 2 refills | Status: DC

## 2021-11-20 MED ORDER — METOPROLOL TARTRATE 25 MG PO TABS
50.0000 mg | ORAL_TABLET | Freq: Once | ORAL | Status: DC
Start: 2021-11-20 — End: 2021-11-20

## 2021-11-20 NOTE — ED Provider Notes (Signed)
?[facility] EMERGENCY DEPARTMENT ?Provider Note ? ? ?CSN: [id number] ?Arrival date & time: 11/20/21  0744 ? ?  ? ?History ? ?Chief Complaint  ?Patient presents with  ? Shoulder Pain  ? ? ?Amy Conway is a 56 y.o. female. ? ?Pt reports repetitive  use at work. Pt reports increasing pain for the past month.   ? ?The history is provided by the patient. No language interpreter was used.  ?Shoulder Pain ?Location:  Shoulder ?Shoulder location:  R shoulder ?Injury: yes   ?Time since incident:  1 month ?Pain details:  ?  Quality:  Aching ?  Radiates to:  Does not radiate ?  Severity:  Moderate ?  Onset quality:  Gradual ?  Duration:  1 month ?  Timing:  Constant ?  Progression:  Worsening ?Prior injury to area:  No ?Relieved by:  Nothing ?Worsened by:  Nothing ?Associated symptoms: no decreased range of motion   ? ?  ? ?Home Medications ?Prior to Admission medications   ?Medication Sig Start Date End Date Taking? Authorizing Provider  ?acetaminophen (TYLENOL) 500 MG tablet Take 500-1,000 mg by mouth every 6 (six) hours as needed (for headaches or mild pain).    [provider]  ?albuterol (PROVENTIL HFA;VENTOLIN HFA) 108 (90 Base) MCG/ACT inhaler Inhale 1-2 puffs into the lungs every 6 (six) hours as needed for wheezing or shortness of breath. ?Patient not taking: No sig reported 09/26/18   Terrilee Files, MD  ?ALEVE 220 MG tablet Take 220-440 mg by mouth 2 (two) times daily as needed (for headaches).    [provider]  ?Aspirin-Acetaminophen-Caffeine (GOODY HEADACHE PO) Take 1 packet by mouth 2 (two) times daily as needed (for headaches).    [provider]  ?Aspirin-Salicylamide-Caffeine (BC HEADACHE POWDER PO) Take 1 packet by mouth 2 (two) times daily as needed (for headaches).    [provider]  ?ciprofloxacin (CIPRO) 500 MG tablet Take 1 tablet (500 mg total) by mouth every 12 (twelve) hours. 09/01/21   Margarita Grizzle, MD  ?metroNIDAZOLE (FLAGYL) 500 MG  tablet Take 1 tablet (500 mg total) by mouth 2 (two) times daily. 09/01/21   Margarita Grizzle, MD  ?polyethylene glycol powder (GLYCOLAX/MIRALAX) 17 GM/SCOOP powder Take 17 g by mouth daily. 09/14/21   Fayrene Helper, PA-C  ?   ? ?Allergies    ?Bee venom, Ibuprofen, Meat extract, Penicillins, and Latex   ? ?Review of Systems   ?Review of Systems  ?Musculoskeletal:  Positive for arthralgias and myalgias. Negative for joint swelling.  ?All other systems reviewed and are negative. ? ?Physical Exam ?Updated Vital Signs ?BP (!) 192/98   Pulse (!) 58   Temp 98.1 ?F (36.7 ?C) (Oral)   Resp 14   Ht 5\' 1"  (1.549 m)   Wt 79.4 kg   LMP 10/15/2013   SpO2 100%   BMI 33.07 kg/m?  ?Physical Exam ?Vitals reviewed.  ?Constitutional:   ?   Appearance: Normal appearance.  ?HENT:  ?   Head: Normocephalic.  ?Cardiovascular:  ?   Rate and Rhythm: Normal rate.  ?Pulmonary:  ?   Effort: Pulmonary effort is normal.  ?Musculoskeletal:     ?   General: Tenderness present. No swelling or deformity.  ?   Cervical back: Normal range of motion.  ?Skin: ?   General: Skin is warm.  ?Neurological:  ?   General: No focal deficit present.  ?   Mental Status: She is alert.  ?Psychiatric:     ?  Mood and Affect: Mood normal.  ? ? ?ED Results / Procedures / Treatments   ?Labs ?(all labs ordered are listed, but only abnormal results are displayed) ?Labs Reviewed - No data to display ? ?EKG ?None ? ?Radiology ?DG Shoulder Right ? ?Result Date: 11/20/2021 ?CLINICAL DATA:  Right shoulder pain for 1 month EXAM: RIGHT SHOULDER - 2+ VIEW COMPARISON:  None. FINDINGS: No fracture. No glenohumeral dislocation. No evidence of acromioclavicular separation. 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 No aggressive appearing focal bone lesions. No radiopaque foreign bodies or pathologic soft tissue calcifications. 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 No acute osseous abnormality.  Electronically Signed   By: Delbert Phenix M.D.   On: 11/20/2021 08:12   ? ?Procedures ?Procedures  ? ? ?Medications Ordered in ED ?Medications - No data to display ? ?ED Course/ Medical Decision Making/ A&P ?  ?                        ?Medical Decision Making ?Problems Addressed: ?Hypertension, unspecified type: acute illness or injury ?   Details: Pt is out of her blood pressure medicaitons.  Pt reports she has not been able to get off from her job to see her MD to get medication ? ?Amount and/or Complexity of Data Reviewed ?Radiology: ordered and independent interpretation performed. Decision-making details documented in ED Course. ?   Details: xray ordered, reviewed and interpreted,  Pt advised no acute injury,  Pt has some degenerative cahnges ? ?Risk ?Prescription drug management. ?Risk Details: Pt given a prescription for metoprolol for blood pressure.  Meloxicam for shoulder,  Pt advised to follow up with primary care and orthopaedist .  Pt given a note for work ? ? ? ? ? ? ? ? ? ? ?Final Clinical Impression(s) / ED Diagnoses ?Final diagnoses:  ?Acute pain of right shoulder  ?Hypertension, unspecified type  ? ? ?Rx / DC Orders ?ED Discharge Orders   ? ?      Ordered  ?  metoprolol tartrate (LOPRESSOR) 50 MG tablet  2 times daily       ? 11/20/21 1221  ?  meloxicam (MOBIC) 15 MG tablet  Daily       ? 11/20/21 1221  ? ?  ?  ? ?  ?An After Visit Summary was printed and given to the patient. ? ? ?  ?Elson Areas, [place] ?11/20/21 1233 ? ?  ?Terrilee Files, MD ?11/21/21 1020 ? ?

## 2021-11-20 NOTE — ED Notes (Signed)
Pt states she noticed when she woke up her right hand seemed swollen. Pt states the shoulder pain shoots to the back and right side of chest.  ?

## 2021-11-20 NOTE — ED Triage Notes (Signed)
Pt reports right shoulder pain for the past month. States she works in [employer] and doesn't know if she may have injured it while working. Pt took multiple OTC medications without relief.  ?

## 2021-12-10 DIAGNOSIS — M25511 Pain in right shoulder: Secondary | ICD-10-CM | POA: Insufficient documentation

## 2021-12-11 DIAGNOSIS — M19011 Primary osteoarthritis, right shoulder: Secondary | ICD-10-CM | POA: Insufficient documentation

## 2022-06-03 IMAGING — CT CT ABD-PELV W/ CM
2 of 5 series · 17 of 46 positions shown, 19 images · IV contrast (Omni 300)
Comparison: CT abdomen pelvis dated 09/01/2021.

CLINICAL DATA: Left lower quadrant abdominal pain.

EXAM:
CT ABDOMEN AND PELVIS WITH CONTRAST
TECHNIQUE: Multidetector CT imaging of the abdomen and pelvis was performed
using the standard protocol following bolus administration of
intravenous contrast.
CONTRAST:  80mL OMNIPAQUE IOHEXOL 350 MG/ML SOLN

[Series 3: a/p w/ 5mm · axial · 0.77mm/px · z∈[+733,+1098]mm · 14 of 83 slices shown, 16 images]
[im 5/83  soft-tissue]
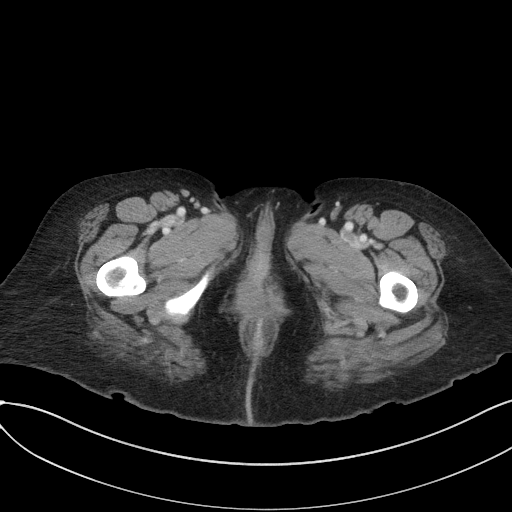
[im 5/83  bone]
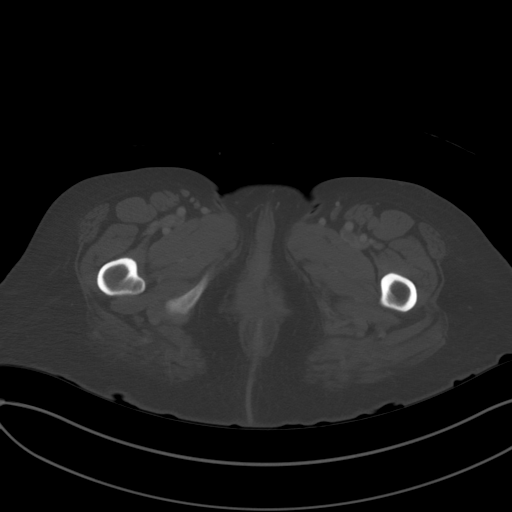
[im 9/83  soft-tissue]
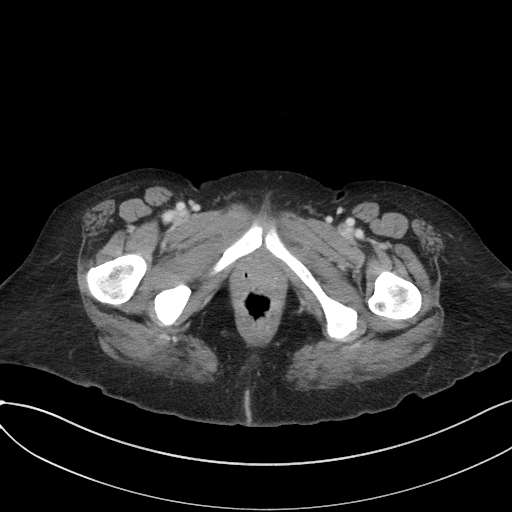
[im 18/83  soft-tissue]
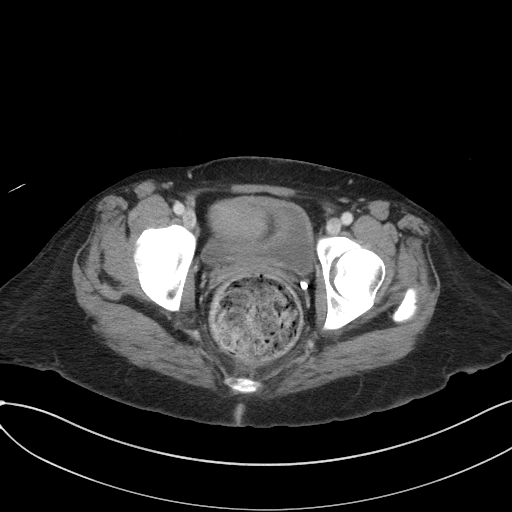
[im 22/83  soft-tissue]
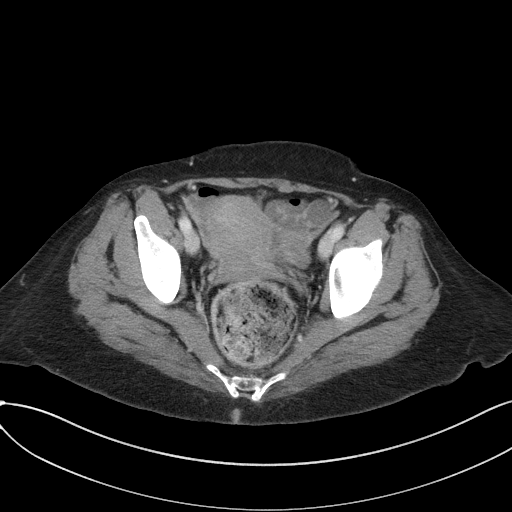
[im 26/83  soft-tissue]
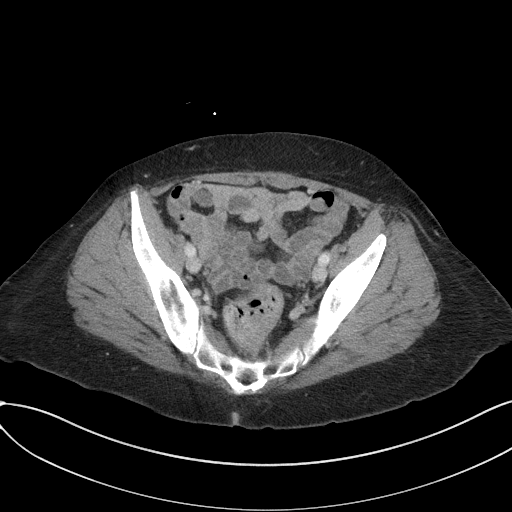
[im 35/83  soft-tissue]
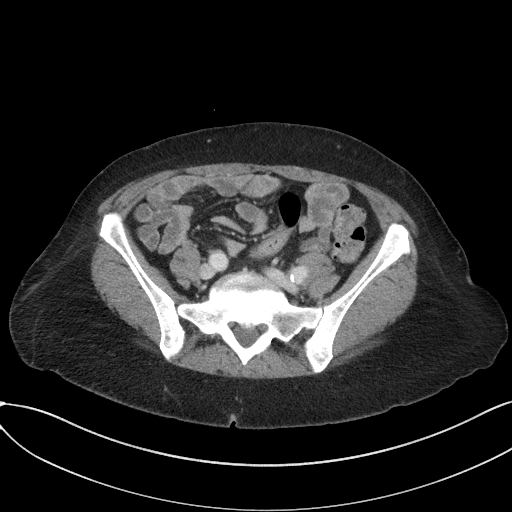
[im 39/83  soft-tissue]
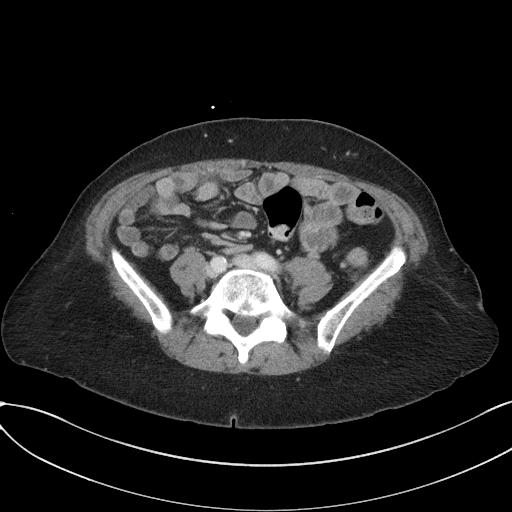
[im 44/83  soft-tissue]
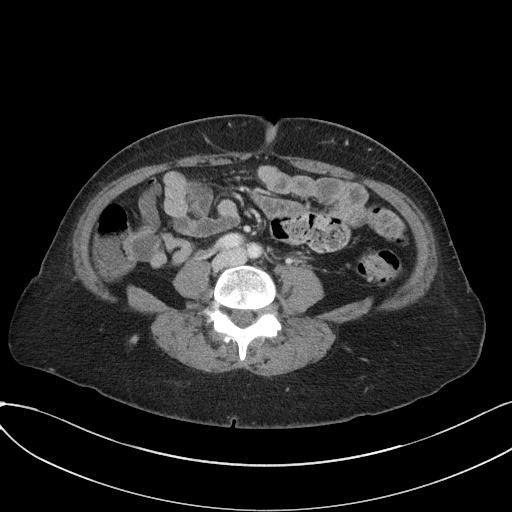
[im 48/83  soft-tissue]
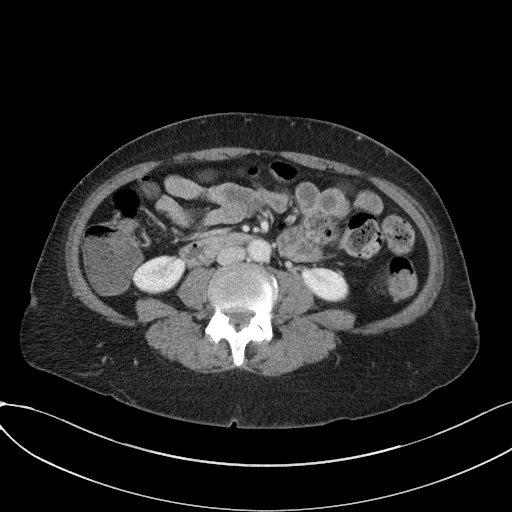
[im 48/83  bone]
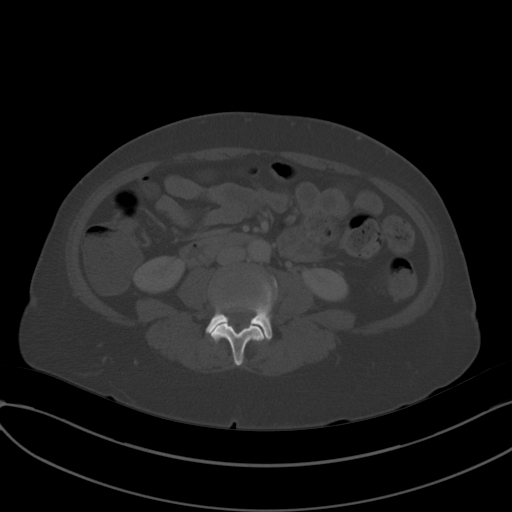
[im 57/83  soft-tissue]
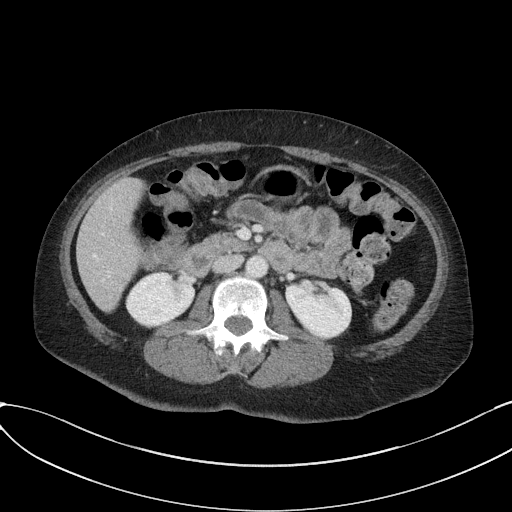
[im 61/83  soft-tissue]
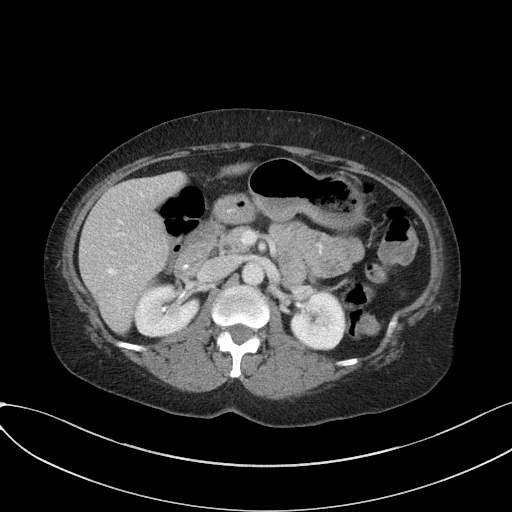
[im 65/83  soft-tissue]
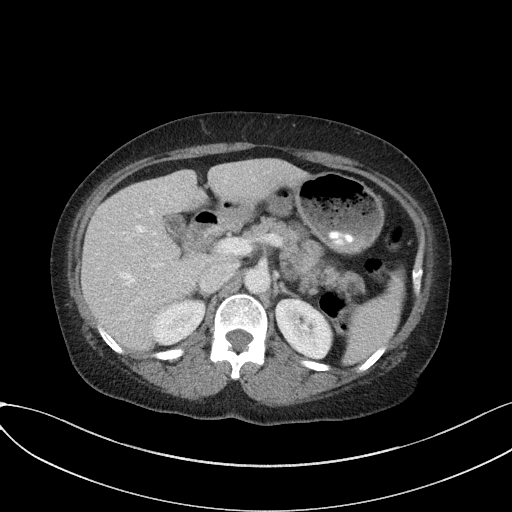
[im 74/83  soft-tissue]
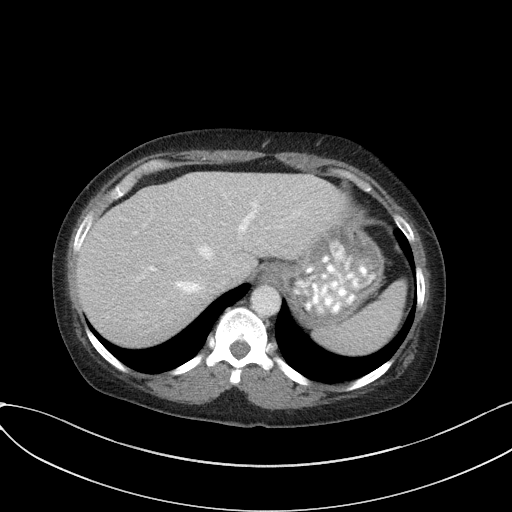
[im 78/83  soft-tissue]
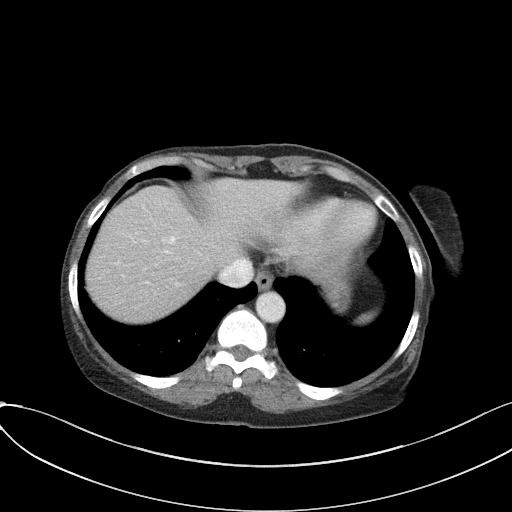

[Series 7: a/p w/ cor · coronal · 0.82mm/px · 3 of 123 slices shown]
[im 41/123  soft-tissue]
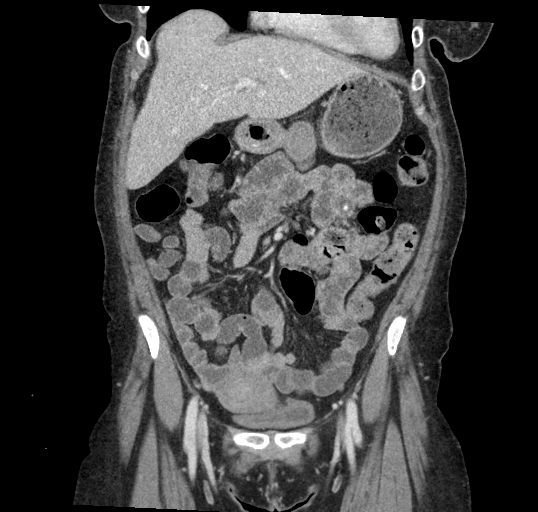
[im 55/123  soft-tissue]
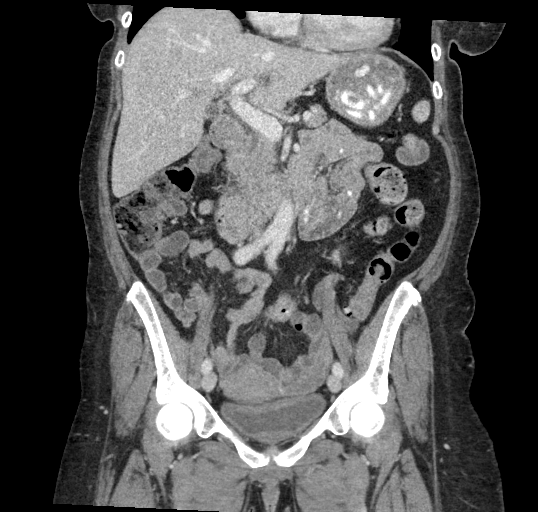
[im 68/123  soft-tissue]
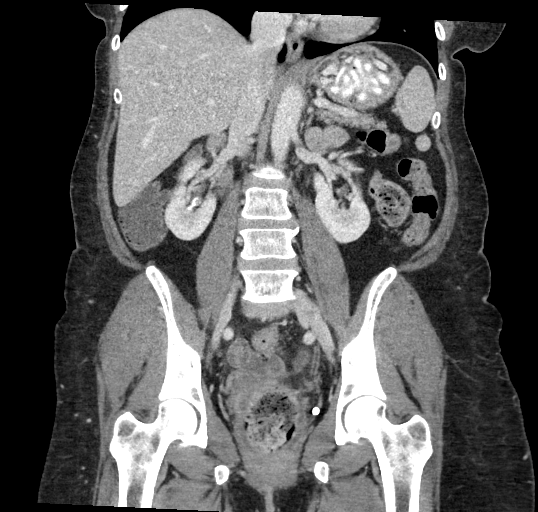

[17 of 46 positions shown; findings below may reference images not displayed]

FINDINGS: Lower chest: The visualized lung bases are clear.

No intra-abdominal free air or free fluid.

Hepatobiliary: No focal liver abnormality is seen. No gallstones,
gallbladder wall thickening, or biliary dilatation.

Pancreas: Unremarkable. No pancreatic ductal dilatation or
surrounding inflammatory changes.

Spleen: Normal in size without focal abnormality.

Adrenals/Urinary Tract: Adrenal glands are unremarkable. Kidneys are
normal, without renal calculi, focal lesion, or hydronephrosis.
Bladder is unremarkable.

Stomach/Bowel: There is a moderate-sized stool in the rectal vault.
Several scattered sigmoid diverticula without active inflammatory
changes. There is no bowel obstruction or active inflammation. The
appendix is normal.

Vascular/Lymphatic: Mild aortoiliac atherosclerotic disease. The IVC
is unremarkable. No portal venous gas. There is no adenopathy.

Reproductive: The uterus is anteverted and grossly unremarkable. No
adnexal masses.

Other: None

Musculoskeletal: No acute or significant osseous findings.
IMPRESSION: 1. No acute intra-abdominal or pelvic pathology.
2. Sigmoid diverticulosis.  No bowel obstruction. Normal appendix.
3. Aortic Atherosclerosis (G22EE-A53.3).

## 2022-06-19 ENCOUNTER — Encounter (HOSPITAL_COMMUNITY): Payer: Self-pay | Admitting: Emergency Medicine

## 2022-06-19 ENCOUNTER — Emergency Department (HOSPITAL_COMMUNITY)
Admission: EM | Admit: 2022-06-19 | Discharge: 2022-06-20 | Disposition: A | Discharge status: Home / Self Care | Payer: Medicaid Other | Attending: Emergency Medicine | Admitting: Emergency Medicine

## 2022-06-19 DIAGNOSIS — T675XXA Heat exhaustion, unspecified, initial encounter: Secondary | ICD-10-CM | POA: Insufficient documentation

## 2022-06-19 DIAGNOSIS — Z91040 Latex allergy status: Secondary | ICD-10-CM | POA: Insufficient documentation

## 2022-06-19 DIAGNOSIS — Z5900 Homelessness unspecified: Secondary | ICD-10-CM | POA: Diagnosis not present

## 2022-06-19 DIAGNOSIS — I10 Essential (primary) hypertension: Secondary | ICD-10-CM | POA: Insufficient documentation

## 2022-06-19 DIAGNOSIS — R42 Dizziness and giddiness: Secondary | ICD-10-CM

## 2022-06-19 DIAGNOSIS — Z79899 Other long term (current) drug therapy: Secondary | ICD-10-CM | POA: Insufficient documentation

## 2022-06-19 NOTE — ED Triage Notes (Addendum)
Dizziness for 2 mos. She reports that she works at [employer]. When she works there and gets hot and weather outside is hot, she gets dizzy and sees spots. When she sits down, it does resolve. She is homeless. CBG 146 per EMS.

## 2022-06-20 NOTE — ED Notes (Signed)
PT given sprite and instructed to drink.

## 2022-06-20 NOTE — ED Notes (Signed)
EDP in room to discuss

## 2022-06-20 NOTE — ED Provider Notes (Signed)
[facility]-EMERGENCY DEPT Provider Note   CSN: [id number] Arrival date & time: 06/19/22  2311     History  No chief complaint on file.   Amy Conway is a 56 y.o. female.  HPI     This is a 56 year old female who presents with dizziness.  Patient reports 87-month history of intermittent dizziness.  Reports that her dizziness is often associated with being at work.  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  She states when she gets dizzy she sees spots.  She denies room spinning dizziness.  Dizziness improves when she sits down.  She states that she often times does not drink enough water.  She is homeless.  Denies chest pain, shortness of breath, abdominal pain, nausea, vomiting.  Home Medications Prior to Admission medications   Medication Sig Start Date End Date Taking? Authorizing Provider  acetaminophen (TYLENOL) 500 MG tablet Take 500-1,000 mg by mouth every 6 (six) hours as needed (for headaches or mild pain).    [provider]  albuterol (PROVENTIL HFA;VENTOLIN HFA) 108 (90 Base) MCG/ACT inhaler Inhale 1-2 puffs into the lungs every 6 (six) hours as needed for wheezing or shortness of breath. Patient not taking: No sig reported 09/26/18   Terrilee Files, MD  ALEVE 220 MG tablet Take 220-440 mg by mouth 2 (two) times daily as needed (for headaches).    [provider]  Aspirin-Acetaminophen-Caffeine (GOODY HEADACHE PO) Take 1 packet by mouth 2 (two) times daily as needed (for headaches).    [provider]  Aspirin-Salicylamide-Caffeine (BC HEADACHE POWDER PO) Take 1 packet by mouth 2 (two) times daily as needed (for headaches).    [provider]  ciprofloxacin (CIPRO) 500 MG tablet Take 1 tablet (500 mg total) by mouth every 12 (twelve) hours. 09/01/21   Margarita Grizzle, MD  meloxicam (MOBIC) 15 MG tablet Take 1 tablet (15 mg total) by mouth daily. 11/20/21 11/20/22  Elson Areas, PA-C  metoprolol  tartrate (LOPRESSOR) 50 MG tablet Take 1 tablet (50 mg total) by mouth 2 (two) times daily. 11/20/21 11/20/22  Elson Areas, PA-C  metroNIDAZOLE (FLAGYL) 500 MG tablet Take 1 tablet (500 mg total) by mouth 2 (two) times daily. 09/01/21   Margarita Grizzle, MD  polyethylene glycol powder (GLYCOLAX/MIRALAX) 17 GM/SCOOP powder Take 17 g by mouth daily. 09/14/21   Fayrene Helper, PA-C      Allergies    Bee venom, Ibuprofen, Meat extract, Penicillins, and Latex    Review of Systems   Review of Systems  Constitutional:  Negative for fever.  Respiratory:  Negative for shortness of breath.   Cardiovascular:  Negative for chest pain.  Neurological:  Positive for light-headedness.  All other systems reviewed and are negative.   Physical Exam Updated Vital Signs BP (!) 132/96   Pulse 64   Temp (!) 97.5 F (36.4 C)   Resp 14   LMP 10/15/2013   SpO2 100%  Physical Exam Vitals and nursing note reviewed.  Constitutional:      Appearance: She is well-developed. She is not ill-appearing.  HENT:     Head: Normocephalic and atraumatic.     Nose: Nose normal.     Mouth/Throat:     Mouth: Mucous membranes are moist.  Eyes:     Pupils: Pupils are equal, round, and reactive to light.  Cardiovascular:     Rate and Rhythm: Normal rate and regular rhythm.     Heart  sounds: Normal heart sounds.  Pulmonary:     Effort: Pulmonary effort is normal. No respiratory distress.     Breath sounds: No wheezing.  Abdominal:     Palpations: Abdomen is soft.  Musculoskeletal:     Cervical back: Neck supple.     Right lower leg: No edema.     Left lower leg: No edema.  Skin:    General: Skin is warm and dry.  Neurological:     Mental Status: She is alert and oriented to person, place, and time.     Comments: Cranial nerves II through XII, V out of V strength in all 4 extremities, no dysmetria to finger-nose-finger  Psychiatric:        Mood and Affect: Mood normal.     ED Results / Procedures / Treatments    Labs (all labs ordered are listed, but only abnormal results are displayed) Labs Reviewed  URINALYSIS, ROUTINE W REFLEX MICROSCOPIC    EKG None  Radiology No results found.  Procedures Procedures    Medications Ordered in ED Medications - No data to display  ED Course/ Medical Decision Making/ A&P Clinical Course as of 06/20/22 0143  Thu Jun 20, 2022  0126 Per nursing intact, patient uncooperative with orthostatics.  She states she could not provide a urine sample because "I just peed on myself." [CH]    Clinical Course User Index [CH] Basil Buffin, Barbette Hair, MD                           Medical Decision Making Amount and/or Complexity of Data Reviewed Labs: ordered.   This patient presents to the ED for concern of headedness, this involves an extensive number of treatment options, and is a complaint that carries with it a high risk of complications and morbidity.  I considered the following differential and admission for this acute, potentially life threatening condition.  The differential diagnosis includes orthostasis, dehydration, arrhythmia, heat exhaustion  MDM:    This is a 56 year old female who presents with intermittent lightheadedness.  She is nontoxic.  Vital signs are reassuring.  Patient is not very cooperative with history taking or exam.  She will not provide much information.  She closes her eyes frequently.  She does not appear focal.  Her physical exam is fairly benign.  EKG shows no evidence of acute arrhythmia or ischemia.  I ordered orthostatics and urinalysis to assess for hydration status.  Given her association with heat and in the kitchen, suspect it may be related to that.  Patient multiple times refused full orthostatic vital signs.  She also stated that she "peed on herself" when she was requested to give a urine sample.  She remained uncooperative.  Discussed with the patient that I cannot help her if she will not cooperate with work-up.  She continues  to decline and close her eyes frequently.  Suspect she may be malingering as well.  We will plan for discharge as she does not appear to have an acute emergent process and is nonfocal.  (Labs, imaging, consults)  Labs: I Ordered, and personally interpreted labs.  The pertinent results include: None  Imaging Studies ordered: I ordered imaging studies including none I independently visualized and interpreted imaging. I agree with the radiologist interpretation  Additional history obtained from chart review.  External records from outside source obtained and reviewed including prior evaluations  Cardiac Monitoring: The patient was maintained on a cardiac monitor.  I  personally viewed and interpreted the cardiac monitored which showed an underlying rhythm of: Sinus rhythm  Reevaluation: After the interventions noted above, I reevaluated the patient and found that they have :stayed the same  Social Determinants of Health: Homeless  Disposition: Discharge  Co morbidities that complicate the patient evaluation  Past Medical History:  Diagnosis Date   Alcohol abuse    Depression    GERD (gastroesophageal reflux disease)    Gout    Homelessness    Hypertension      Medicines No orders of the defined types were placed in this encounter.   I have reviewed the patients home medicines and have made adjustments as needed  Problem List / ED Course: Problem List Items Addressed This Visit   None Visit Diagnoses     Episodic lightheadedness    -  Primary   Heat exhaustion, initial encounter                       Final Clinical Impression(s) / ED Diagnoses Final diagnoses:  Episodic lightheadedness  Heat exhaustion, initial encounter    Rx / DC Orders ED Discharge Orders     None         Shon Baton, MD 06/20/22 [id number]

## 2022-06-20 NOTE — Discharge Instructions (Signed)
You were seen today for lightheadedness.  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  Make sure that you are staying hydrated.  Sit down if you begin to feel dizzy.

## 2022-07-16 ENCOUNTER — Emergency Department (HOSPITAL_COMMUNITY)
Admission: EM | Admit: 2022-07-16 | Discharge: 2022-07-17 | Discharge status: Against Medical Advice | Payer: Medicaid Other | Attending: Emergency Medicine | Admitting: Emergency Medicine

## 2022-07-16 ENCOUNTER — Encounter (HOSPITAL_COMMUNITY): Payer: Self-pay

## 2022-07-16 DIAGNOSIS — Z5321 Procedure and treatment not carried out due to patient leaving prior to being seen by health care provider: Secondary | ICD-10-CM | POA: Insufficient documentation

## 2022-07-16 DIAGNOSIS — R531 Weakness: Secondary | ICD-10-CM | POA: Diagnosis not present

## 2022-07-16 DIAGNOSIS — R200 Anesthesia of skin: Secondary | ICD-10-CM | POA: Insufficient documentation

## 2022-07-16 DIAGNOSIS — R202 Paresthesia of skin: Secondary | ICD-10-CM | POA: Insufficient documentation

## 2022-07-16 NOTE — ED Provider Triage Note (Signed)
Emergency Medicine Provider Triage Evaluation Note  Amy Conway , a 56 y.o. female  was evaluated in triage.  Pt complains of right upper arm pain and numbness/tingling of the right hand.  States she has been unable to write a pen or move her hand around the way she normally does.  States this is never happened before.  Started when she woke up this morning.  Denies any recent injury, fevers, or chills.  No Hx of blood clots.  Not on anticoagulation.  Review of Systems  Positive:  Negative: See above  Physical Exam  BP (!) 155/104   Pulse 79   Temp 98.3 F (36.8 C) (Oral)   Resp 20   Ht 5\' 1"  (1.549 m)   Wt 72.6 kg   LMP 10/15/2013   SpO2 100%   BMI 30.23 kg/m  Gen:   Awake, no distress   Resp:  Normal effort  MSK:   Moves extremities without difficulty  Other:  Malpositioning of the right wrist without bony deformity, suspicious for radial nerve palsy.  Significant tenderness of the right upper arm.  Decreased global sensation of the right hand.  Mild temperature difference of the right arm versus left arm.  No facial asymmetry.  Gaze aligned appropriately.  Lower extremities appear neurovascularly intact.  Medical Decision Making  Medically screening exam initiated at 3:55 PM.  Appropriate orders placed.  BRITANEY ESPAILLAT was informed that the remainder of the evaluation will be completed by another provider, this initial triage assessment does not replace that evaluation, and the importance of remaining in the ED until their evaluation is complete.  Consulted with Sharlett Iles regarding this patient, who came and evaluated the patient.  See his note for details.  Low suspicion for arterial occlusion at this time.  Most likely presenting with radial nerve palsy.   Prince Rome, PA-C 25/95/63 [id number]

## 2022-07-16 NOTE — ED Notes (Signed)
Pt not answering to be triage  

## 2022-07-16 NOTE — ED Notes (Signed)
Pt called for VS, no response.  °

## 2022-07-16 NOTE — ED Triage Notes (Signed)
Pt states numbness in right arm since 0500. 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 Pt is ambulatory, no other neuro symptoms.

## 2022-07-16 NOTE — ED Provider Notes (Signed)
Asked evaluate the patient in triage.  56 year old female presents to the emergency department with numbness and weakness of her right arm.  Says that she went to bed last night at 10 PM with her right arm propped up on her pillow.  Says that when she awoke she has had pain and tingling in her right mid arm that radiates down to her hand.  Also states that she has had difficulty using her right hand that is numb.  No trauma to the area.  No vision changes, facial droop, or slurred speech.  No weakness or numbness of her leg.  No history of strokes.  On exam has decreased sensation of the hand with the exception of the dorsal radial aspect of the hand.  Radial pulse and ulnar pulses are 2+ bilaterally and cap refill is less than 3 seconds in all 10 digits.  Does have weakness in wrist extension and thumb extension on the right hand.  Also has very mild weakness in grip of the right hand.  Feel the patient likely has radial nerve palsy causing her symptoms.  No LVO symptoms that would suggest that she needs a stroke evaluation at this time since last known well would be 10 PM.   Fransico Meadow, MD 07/16/22 1601

## 2022-07-29 ENCOUNTER — Emergency Department (HOSPITAL_COMMUNITY)
Admission: EM | Admit: 2022-07-29 | Discharge: 2022-07-30 | Disposition: A | Discharge status: Home / Self Care | Payer: Medicaid Other | Attending: Emergency Medicine | Admitting: Emergency Medicine

## 2022-07-29 ENCOUNTER — Emergency Department (HOSPITAL_COMMUNITY): Payer: Medicaid Other

## 2022-07-29 ENCOUNTER — Other Ambulatory Visit: Payer: Self-pay

## 2022-07-29 DIAGNOSIS — G5631 Lesion of radial nerve, right upper limb: Secondary | ICD-10-CM | POA: Insufficient documentation

## 2022-07-29 DIAGNOSIS — M79601 Pain in right arm: Secondary | ICD-10-CM | POA: Diagnosis present

## 2022-07-29 LAB — COMPREHENSIVE METABOLIC PANEL
ALT: 30 U/L (ref 0–44)
AST: 33 U/L (ref 15–41)
Albumin: 4 g/dL (ref 3.5–5.0)
Alkaline Phosphatase: 85 U/L (ref 38–126)
Anion gap: 10 (ref 5–15)
BUN: 11 mg/dL (ref 6–20)
CO2: 25 mmol/L (ref 22–32)
Calcium: 9.3 mg/dL (ref 8.9–10.3)
Chloride: 102 mmol/L (ref 98–111)
Creatinine, Ser: 1 mg/dL (ref 0.44–1.00)
GFR, Estimated: >60 mL/min (ref >60)
Glucose, Bld: 122 mg/dL — ABNORMAL HIGH (ref 70–99)
Potassium: 3.9 mmol/L (ref 3.5–5.1)
Sodium: 137 mmol/L (ref 135–145)
Total Bilirubin: 0.7 mg/dL (ref 0.3–1.2)
Total Protein: 7.1 g/dL (ref 6.5–8.1)

## 2022-07-29 NOTE — ED Triage Notes (Signed)
Pt reports RUE pain and swelling x 3 days. 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 Does endorse an element of numbness but speaks mostly to pain aspect. Seen for same previously but states she had to leave d/t family emergency.

## 2022-07-29 NOTE — ED Provider Triage Note (Signed)
Emergency Medicine Provider Triage Evaluation Note  Amy Conway , a 56 y.o. female  was evaluated in triage.  Pt complains of unable to use right hand for a week.  Pt reports she can not grip  Review of Systems  Positive: Pain in shoulder Negative: fever  Physical Exam  LMP 10/15/2013  Gen:   Awake, no distress   Resp:  Normal effort  MSK:   Pt unable to make a fist, unable to move wrist  Other:    Medical Decision Making  Medically screening exam initiated at 1:16 PM.  Appropriate orders placed.  CERA RORKE was informed that the remainder of the evaluation will be completed by another provider, this initial triage assessment does not replace that evaluation, and the importance of remaining in the ED until their evaluation is complete.  I am concerned pt has had cva,  2 weeks ago   Elson Areas, [place] 07/29/22 1318

## 2022-07-30 NOTE — Discharge Instructions (Addendum)
Your symptoms are thought to be a radial nerve palsy.  Please follow-up with the neurologist.  Their office should contact you.

## 2022-07-30 NOTE — ED Notes (Signed)
Splint applied on patient

## 2022-07-30 NOTE — ED Provider Notes (Signed)
[facility]-EMERGENCY DEPT [facility] Emergency Department Provider Note MRN:  [id number]  Arrival date & time: 07/30/22     Chief Complaint   Arm Pain   History of Present Illness   Amy Conway is a 56 y.o. year-old female presents to the ED with chief complaint of right hand weakness and right arm pain.  States that over the past two weeks she has had weakness to the right had.  Unable to grasp objects like normal.  Unable to extend wrist.  Denies trauma.  Chart review shows that the patient checked in 2 weeks ago and reportedly fell asleep with her arm propped up on her pillow.  When she awoke, the symptoms had started.  History provided by patient.   Review of Systems  Pertinent positive and negative review of systems noted in HPI.    Physical Exam   Vitals:   07/29/22 2342 07/30/22 0359  BP: (!) 147/102 (!) 168/97  Pulse: 67 66  Resp: 16 18  Temp: 97.7 F (36.5 C) 97.9 F (36.6 C)  SpO2: 100% 100%    CONSTITUTIONAL:  well-appearing, NAD NEURO:  Alert and oriented x 3, CN 3-12 grossly intact EYES:  eyes equal and reactive ENT/NECK:  Supple, no stridor  CARDIO:  normal rate, regular rhythm, appears well-perfused  PULM:  No respiratory distress,  GI/GU:  non-distended,  MSK/SPINE:  No gross deformities, no edema, unable to extend right wrist, decreased grip strength, decreased sensation SKIN:  no rash, atraumatic   *Additional and/or pertinent findings included in MDM below  Diagnostic and Interventional Summary    EKG Interpretation  Date/Time:    Ventricular Rate:    PR Interval:    QRS Duration:   QT Interval:    QTC Calculation:   R Axis:     Text Interpretation:         Labs Reviewed  COMPREHENSIVE METABOLIC PANEL - Abnormal; Notable for the following components:      Result Value   Glucose, Bld 122 (*)    All other components within normal limits  CBC WITH DIFFERENTIAL/PLATELET    CT Head Wo Contrast  Final Result      Medications -  No data to display   Procedures  /  Critical Care Procedures  ED Course and Medical Decision Making  I have reviewed the triage vital signs, the nursing notes, and pertinent available records from the EMR.  Social Determinants Affecting Complexity of Care: Patient has no clinically significant social determinants affecting this chief complaint..   ED Course:    Medical Decision Making Patient here with right wrist and hand weakness.  Onset was 2 weeks ago.  Chart review shows that patient was seen on 10/31 for similar.  Patient then stated that she had fallen asleep with her arm propped up on a pillow.  Symptoms seem consistent with a radial nerve palsy.  She did have a CT scan of her head in triage today which was negative.  Given that the symptoms have been ongoing for the past 2 weeks, I would expect the CT to have shown something if she were of central origin.  Patient will be given a Velcro wrist splint and encouraged to follow-up with neurology.  Ambulatory referral placed.  Amount and/or Complexity of Data Reviewed Radiology: independent interpretation performed.    Details: No ICH, no obvious stroke  Risk Decision regarding hospitalization.     Consultants: No consultations were needed in caring for this patient.   Treatment  and Plan: I considered admission due to patient's initial presentation, but after considering the examination and diagnostic results, patient will not require admission and can be discharged with outpatient follow-up.    Final Clinical Impressions(s) / ED Diagnoses     ICD-10-CM   1. 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  Please follow-up with the neurologist.  Their office should contact you.       Roxy Horseman, PA-C 07/30/22 7943    Nicanor Alcon, April, MD 07/30/22 [phone]

## 2022-07-30 NOTE — ED Notes (Signed)
ED Provider at bedside. 

## 2022-08-02 ENCOUNTER — Emergency Department (HOSPITAL_COMMUNITY)
Admission: EM | Admit: 2022-08-02 | Discharge: 2022-08-02 | Disposition: A | Discharge status: Home / Self Care | Payer: Medicaid Other | Attending: Emergency Medicine | Admitting: Emergency Medicine

## 2022-08-02 ENCOUNTER — Emergency Department (HOSPITAL_COMMUNITY): Payer: Medicaid Other

## 2022-08-02 ENCOUNTER — Encounter (HOSPITAL_COMMUNITY): Payer: Self-pay

## 2022-08-02 ENCOUNTER — Other Ambulatory Visit: Payer: Self-pay

## 2022-08-02 DIAGNOSIS — I10 Essential (primary) hypertension: Secondary | ICD-10-CM | POA: Diagnosis not present

## 2022-08-02 DIAGNOSIS — G5631 Lesion of radial nerve, right upper limb: Secondary | ICD-10-CM | POA: Diagnosis not present

## 2022-08-02 DIAGNOSIS — Z79899 Other long term (current) drug therapy: Secondary | ICD-10-CM | POA: Insufficient documentation

## 2022-08-02 DIAGNOSIS — M79601 Pain in right arm: Secondary | ICD-10-CM | POA: Diagnosis present

## 2022-08-02 DIAGNOSIS — Z91040 Latex allergy status: Secondary | ICD-10-CM | POA: Diagnosis not present

## 2022-08-02 MED ORDER — GABAPENTIN 100 MG PO CAPS: 100.0000 mg | ORAL_CAPSULE | Freq: Three times a day (TID) | ORAL | 0 refills | Status: AC

## 2022-08-02 NOTE — Discharge Instructions (Signed)
You were seen in the emergency department today for the nerve palsy in your right arm. I have prescribed you gabapentin which is a nerve pain medication. Please take as prescribed. Please continue to use your wrist splint to keep it in proper alignment. Please follow-up with neurology on 09/30/22. Return for significantly worsening symptoms or facial droop, weakness moving up your arm, weakness of your leg or other signs of stroke.

## 2022-08-02 NOTE — ED Triage Notes (Signed)
Complaining of right arm pain that started one month ago, she has already been diagnosed with radial nerve palsy.

## 2022-08-02 NOTE — ED Provider Notes (Signed)
[facility]-EMERGENCY DEPT Provider Note   CSN: [id number] Arrival date & time: 08/02/22  2045     History  Chief Complaint  Patient presents with   Arm Pain    Amy Conway is a 56 y.o. female. With past medical history of alcohol abuse, homelessness, hypertension, GERD who presents to the emergency department with arm pain.  States that over the past 3 weeks she has had decreased function of her right arm. 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 She is having ongoing pain to her right arm including the right shoulder and around the right clavicle and scapula. She denies trauma to her arm or overstretching/reaching. Denies other nerve dysfunction. She was see on 07/29/22 for the same and was diagnosed with a radial nerve palsy. At that time she had CT head to ensure no stroke which was negative. Basic labs were normal. Additionally, she was seen on 07/16/22 and complained that she went to bed with her right arm propped up and woke up with pain and tingling radiating down the right arm into the right hand. Here she is denying falling asleep on her arm. Has referral to neurology but states her appointment is not until 09/30/22 and is having ongoing pain.    Arm Pain       Home Medications Prior to Admission medications   Medication Sig Start Date End Date Taking? Authorizing Provider  gabapentin (NEURONTIN) 100 MG capsule Take 1 capsule (100 mg total) by mouth 3 (three) times daily. 08/02/22 09/01/22 Yes Cristopher Peru, PA-C  acetaminophen (TYLENOL) 500 MG tablet Take 500-1,000 mg by mouth every 6 (six) hours as needed (for headaches or mild pain).    [provider]  albuterol (PROVENTIL HFA;VENTOLIN HFA) 108 (90 Base) MCG/ACT inhaler Inhale 1-2 puffs into the lungs every 6 (six) hours as needed for wheezing or shortness of breath. Patient not taking: No sig reported 09/26/18   Terrilee Files, MD  ALEVE 220  MG tablet Take 220-440 mg by mouth 2 (two) times daily as needed (for headaches).    [provider]  Aspirin-Acetaminophen-Caffeine (GOODY HEADACHE PO) Take 1 packet by mouth 2 (two) times daily as needed (for headaches).    [provider]  Aspirin-Salicylamide-Caffeine (BC HEADACHE POWDER PO) Take 1 packet by mouth 2 (two) times daily as needed (for headaches).    [provider]  ciprofloxacin (CIPRO) 500 MG tablet Take 1 tablet (500 mg total) by mouth every 12 (twelve) hours. 09/01/21   Margarita Grizzle, MD  meloxicam (MOBIC) 15 MG tablet Take 1 tablet (15 mg total) by mouth daily. 11/20/21 11/20/22  Elson Areas, PA-C  metoprolol tartrate (LOPRESSOR) 50 MG tablet Take 1 tablet (50 mg total) by mouth 2 (two) times daily. 11/20/21 11/20/22  Elson Areas, PA-C  metroNIDAZOLE (FLAGYL) 500 MG tablet Take 1 tablet (500 mg total) by mouth 2 (two) times daily. 09/01/21   Margarita Grizzle, MD  polyethylene glycol powder (GLYCOLAX/MIRALAX) 17 GM/SCOOP powder Take 17 g by mouth daily. 09/14/21   Fayrene Helper, PA-C      Allergies    Bee venom, Ibuprofen, Meat extract, Penicillins, and Latex    Review of Systems   Review of Systems  Neurological:  Positive for weakness and numbness.  All other systems reviewed and are negative.   Physical Exam Updated Vital Signs BP 130/85 (BP Location: Left Arm)   Pulse 87   Temp  98.5 F (36.9 C) (Oral)   Resp 18   Ht 5\' 1"  (1.549 m)   Wt 77.1 kg   LMP 10/15/2013   SpO2 100%   BMI 32.12 kg/m  Physical Exam Vitals and nursing note reviewed.  Constitutional:      General: She is not in acute distress.    Appearance: Normal appearance. She is not ill-appearing or toxic-appearing.  HENT:     Head: Normocephalic and atraumatic.  Eyes:     General: No scleral icterus.    Extraocular Movements: Extraocular movements intact.     Pupils: Pupils are equal, round, and reactive to light.  Cardiovascular:     Rate and Rhythm: Normal rate  and regular rhythm.     Pulses: Normal pulses.     Heart sounds: No murmur heard. Pulmonary:     Effort: Pulmonary effort is normal. No respiratory distress.  Musculoskeletal:     Cervical back: Normal range of motion and neck supple. No rigidity or tenderness.  Skin:    General: Skin is warm and dry.     Capillary Refill: Capillary refill takes less than 2 seconds.     Findings: No rash.  Neurological:     Mental Status: She is alert and oriented to person, place, and time.     Cranial Nerves: No cranial nerve deficit.     Sensory: Sensory deficit present.     Motor: Weakness present.     Comments: Right radial nerve palsy Cannot extend right wrist  Numbness to the right thumb, and first through 3rd digits. Increased sensation to the pinky. Decreased strength to flexion of the elbow. Full ROM of the right shoulder. Radial pulse 2+  Psychiatric:        Mood and Affect: Mood normal.        Behavior: Behavior normal.        Thought Content: Thought content normal.        Judgment: Judgment normal.     ED Results / Procedures / Treatments   Labs (all labs ordered are listed, but only abnormal results are displayed) Labs Reviewed - No data to display  EKG None  Radiology DG Chest 2 View  Result Date: 08/02/2022 CLINICAL DATA:  Right arm pain. Pain in shoulder and chest. New onset radial nerve palsy. Assess for tumor. EXAM: CHEST - 2 VIEW COMPARISON:  09/01/2021 FINDINGS: The cardiomediastinal contours are normal. No evidence of pulmonary mass or visualized nodule. The lungs are clear. Pulmonary vasculature is normal. No consolidation, pleural effusion, or pneumothorax. No acute osseous abnormalities are seen. IMPRESSION: Negative radiographs of the chest. No pulmonary mass or visualized nodule by radiograph. Electronically Signed   By: 09/03/2021 M.D.   On: 08/02/2022 22:02    Procedures Procedures   Medications Ordered in ED Medications - No data to display  ED  Course/ Medical Decision Making/ A&P                           Medical Decision Making Amount and/or Complexity of Data Reviewed Radiology: ordered.  Risk Prescription drug management.  This patient presents to the ED with chief complaint(s) of right arm weakness with pertinent past medical history of GERD, alcohol abuse, hypertension which further complicates the presenting complaint. The complaint involves an extensive differential diagnosis and also carries with it a high risk of complications and morbidity.    The differential diagnosis includes radiculopathy, stroke, musculoskeletal, neurodegenerative  Additional history  obtained: Additional history obtained from  not available Records reviewed Care Everywhere/External Records and Primary Care Documents most recent 2 ED visits  ED Course and Reassessment: 56 year old female who presents to the emergency department with ongoing pain to the right arm.  On initial exam she does indeed have a right-sided radial nerve palsy.  Without her splint in place she has inability to extend of the right wrist.  She also has numbness to the thumb and first through third digits.  She has increase sensation to the right pinky.  Has range of motion of the shoulder and of the elbow although she has weakness to flexion of the right elbow.  On chart review she was seen previously for this.  She had a CT head which was negative for any acute stroke causing her right arm weakness.  Additionally her labs were normal. I asked her about sleeping with her arm above her head and she denies this but was seen on 07/16/2022 for this exact reason.  It is somewhat unusual that she has developed a radial nerve palsy.  Obtain a chest x-ray to ensure that there is no mass pressing on her brachial plexus causing her symptoms.  The chest x-ray is negative for any obvious mass.  She has follow-up with neurology on 09/30/2022.  I will prescribe her gabapentin in the meantime  for her nervelike pain.  She is instructed to continue using the splint/brace to prevent contracture.  Given return precautions for progressing symptoms.  Do not feel that this is neurodegenerative at this time such as multiple sclerosis, etc.  Patient is agreeable to plan.  Safe for discharge at this time.  Independent labs interpretation:  The following labs were independently interpreted: Not indicated  Independent visualization of imaging: - I independently visualized the following imaging with scope of interpretation limited to determining acute life threatening conditions related to emergency care: Chest x-ray, which revealed no acute findings  Consultation: - Consulted or discussed management/test interpretation w/ external professional: Not indicated  Consideration for admission or further workup: Not indicated Social Determinants of health: Low health literacy Final Clinical Impression(s) / ED Diagnoses Final diagnoses:  Radial nerve palsy, right    Rx / DC Orders ED Discharge Orders          Ordered    gabapentin (NEURONTIN) 100 MG capsule  3 times daily        08/02/22 2214              Cristopher Peru, PA-C 08/03/22 1916    Sloan Leiter, DO 08/04/22 1501

## 2022-09-30 ENCOUNTER — Encounter: Payer: Self-pay | Admitting: Neurology

## 2022-09-30 ENCOUNTER — Ambulatory Visit: Payer: Medicaid Other | Admitting: Neurology

## 2023-02-13 ENCOUNTER — Emergency Department (HOSPITAL_COMMUNITY)
Admission: EM | Admit: 2023-02-13 | Discharge: 2023-02-13 | Disposition: A | Discharge status: Home / Self Care | Payer: Medicaid Other | Attending: Emergency Medicine | Admitting: Emergency Medicine

## 2023-02-13 ENCOUNTER — Emergency Department (HOSPITAL_COMMUNITY): Payer: Medicaid Other

## 2023-02-13 ENCOUNTER — Other Ambulatory Visit: Payer: Self-pay

## 2023-02-13 ENCOUNTER — Encounter (HOSPITAL_COMMUNITY): Payer: Self-pay

## 2023-02-13 DIAGNOSIS — F10929 Alcohol use, unspecified with intoxication, unspecified: Secondary | ICD-10-CM | POA: Insufficient documentation

## 2023-02-13 DIAGNOSIS — I10 Essential (primary) hypertension: Secondary | ICD-10-CM | POA: Diagnosis not present

## 2023-02-13 DIAGNOSIS — R519 Headache, unspecified: Secondary | ICD-10-CM | POA: Diagnosis present

## 2023-02-13 DIAGNOSIS — S0083XA Contusion of other part of head, initial encounter: Secondary | ICD-10-CM | POA: Insufficient documentation

## 2023-02-13 LAB — BASIC METABOLIC PANEL
Anion gap: 10 (ref 5–15)
BUN: 12 mg/dL (ref 6–20)
CO2: 22 mmol/L (ref 22–32)
Calcium: 8.5 mg/dL — ABNORMAL LOW (ref 8.9–10.3)
Chloride: 106 mmol/L (ref 98–111)
Creatinine, Ser: 0.65 mg/dL (ref 0.44–1.00)
GFR, Estimated: >60 mL/min (ref >60)
Glucose, Bld: 96 mg/dL (ref 70–99)
Potassium: 3.1 mmol/L — ABNORMAL LOW (ref 3.5–5.1)
Sodium: 138 mmol/L (ref 135–145)

## 2023-02-13 LAB — CBC WITH DIFFERENTIAL/PLATELET
Abs Immature Granulocytes: 0.01 10*3/uL (ref 0.00–0.07)
Basophils Absolute: 0 10*3/uL (ref 0.0–0.1)
Basophils Relative: 0 %
Eosinophils Absolute: 0.2 10*3/uL (ref 0.0–0.5)
Eosinophils Relative: 3 %
HCT: 43 % (ref 36.0–46.0)
Hemoglobin: 13.3 g/dL (ref 12.0–15.0)
Immature Granulocytes: 0 %
Lymphocytes Relative: 32 %
Lymphs Abs: 2 10*3/uL (ref 0.7–4.0)
MCH: 24.4 pg — ABNORMAL LOW (ref 26.0–34.0)
MCHC: 30.9 g/dL (ref 30.0–36.0)
MCV: 78.9 fL — ABNORMAL LOW (ref 80.0–100.0)
Monocytes Absolute: 0.4 10*3/uL (ref 0.1–1.0)
Monocytes Relative: 7 %
Neutro Abs: 3.5 10*3/uL (ref 1.7–7.7)
Neutrophils Relative %: 58 %
Platelets: 197 10*3/uL (ref 150–400)
RBC: 5.45 MIL/uL — ABNORMAL HIGH (ref 3.87–5.11)
RDW: 15.8 % — ABNORMAL HIGH (ref 11.5–15.5)
WBC: 6.1 10*3/uL (ref 4.0–10.5)
nRBC: 0 % (ref 0.0–0.2)

## 2023-02-13 LAB — ETHANOL: Alcohol, Ethyl (B): 238 mg/dL — ABNORMAL HIGH (ref <10)

## 2023-02-13 MED ORDER — ACETAMINOPHEN 500 MG PO TABS
1000.0000 mg | ORAL_TABLET | Freq: Once | ORAL | Status: AC
  Administered 2023-02-13: 1000 mg via ORAL
  Filled 2023-02-13: qty 2

## 2023-02-13 MED ORDER — CYCLOBENZAPRINE HCL 10 MG PO TABS: 10.0000 mg | ORAL_TABLET | Freq: Two times a day (BID) | ORAL | 0 refills | Status: AC | PRN

## 2023-02-13 MED ORDER — ACETAMINOPHEN 500 MG PO TABS: 1000.0000 mg | ORAL_TABLET | Freq: Four times a day (QID) | ORAL | 0 refills | Status: AC | PRN
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
## 2023-02-13 NOTE — ED Notes (Signed)
Pt yelling and using profanity, this RN to room to ask pt politely to stop yelling. This RN was told to "shut the fuck up". Pt continually rude to staff and is uncooperative for assessment.

## 2023-02-13 NOTE — ED Notes (Signed)
Pt ambulated to and from restroom w/out assistance.

## 2023-02-13 NOTE — ED Notes (Signed)
Pt is ambulatory from EMS stretcher to restroom w/out assistance. Pt obtained urine sample and was ambulated back to room without incident. Urine sample is on sink in room.
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
## 2023-12-01 ENCOUNTER — Emergency Department (HOSPITAL_COMMUNITY): Payer: MEDICAID

## 2023-12-01 ENCOUNTER — Other Ambulatory Visit: Payer: Self-pay

## 2023-12-01 ENCOUNTER — Emergency Department (HOSPITAL_COMMUNITY)
Admission: EM | Admit: 2023-12-01 | Discharge: 2023-12-01 | Disposition: A | Discharge status: Home / Self Care | Payer: MEDICAID | Attending: Emergency Medicine | Admitting: Emergency Medicine

## 2023-12-01 ENCOUNTER — Encounter (HOSPITAL_COMMUNITY): Payer: Self-pay

## 2023-12-01 DIAGNOSIS — S40011A Contusion of right shoulder, initial encounter: Secondary | ICD-10-CM | POA: Diagnosis not present

## 2023-12-01 DIAGNOSIS — I10 Essential (primary) hypertension: Secondary | ICD-10-CM | POA: Diagnosis not present

## 2023-12-01 DIAGNOSIS — S4991XA Unspecified injury of right shoulder and upper arm, initial encounter: Secondary | ICD-10-CM | POA: Diagnosis present

## 2023-12-01 DIAGNOSIS — Z79899 Other long term (current) drug therapy: Secondary | ICD-10-CM | POA: Diagnosis not present

## 2023-12-01 DIAGNOSIS — Z91040 Latex allergy status: Secondary | ICD-10-CM | POA: Insufficient documentation
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
## 2023-12-17 ENCOUNTER — Encounter (HOSPITAL_COMMUNITY): Payer: Self-pay | Admitting: Emergency Medicine

## 2023-12-17 ENCOUNTER — Emergency Department (HOSPITAL_COMMUNITY): Payer: MEDICAID

## 2023-12-17 ENCOUNTER — Emergency Department (HOSPITAL_COMMUNITY)
Admission: EM | Admit: 2023-12-17 | Discharge: 2023-12-17 | Disposition: A | Discharge status: Home / Self Care | Payer: MEDICAID | Attending: Emergency Medicine | Admitting: Emergency Medicine

## 2023-12-17 ENCOUNTER — Other Ambulatory Visit: Payer: Self-pay

## 2023-12-17 DIAGNOSIS — Z91040 Latex allergy status: Secondary | ICD-10-CM | POA: Diagnosis not present

## 2023-12-17 DIAGNOSIS — I10 Essential (primary) hypertension: Secondary | ICD-10-CM | POA: Diagnosis not present

## 2023-12-17 DIAGNOSIS — M25562 Pain in left knee: Secondary | ICD-10-CM

## 2023-12-17 DIAGNOSIS — Z79899 Other long term (current) drug therapy: Secondary | ICD-10-CM | POA: Insufficient documentation

## 2023-12-17 DIAGNOSIS — Z5900 Homelessness unspecified: Secondary | ICD-10-CM | POA: Insufficient documentation

## 2023-12-17 DIAGNOSIS — M25572 Pain in left ankle and joints of left foot: Secondary | ICD-10-CM | POA: Diagnosis not present

## 2023-12-17 LAB — RPR: RPR Ser Ql: NONREACTIVE

## 2023-12-17 LAB — HIV ANTIBODY (ROUTINE TESTING W REFLEX): HIV Screen 4th Generation wRfx: NONREACTIVE

## 2023-12-17 MED ORDER — METOPROLOL TARTRATE 50 MG PO TABS: 50.0000 mg | ORAL_TABLET | Freq: Two times a day (BID) | ORAL | 0 refills | Status: DC

## 2023-12-17 MED ORDER — ALBUTEROL SULFATE HFA 108 (90 BASE) MCG/ACT IN AERS: 1.0000 | INHALATION_SPRAY | Freq: Four times a day (QID) | RESPIRATORY_TRACT | 0 refills | Status: AC | PRN

## 2023-12-17 MED ORDER — ALBUTEROL SULFATE HFA 108 (90 BASE) MCG/ACT IN AERS
1.0000 | INHALATION_SPRAY | Freq: Once | RESPIRATORY_TRACT | Status: AC
  Administered 2023-12-17: 2 via RESPIRATORY_TRACT
  Filled 2023-12-17: qty 6.7
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
## 2023-12-17 NOTE — Discharge Instructions (Signed)
 As discussed, your x-rays appeared normal.  Will refill your blood pressure medicine as well as your albuterol inhaler as we discussed.  Recommend follow-up with your primary care for reassessment.  Regarding your knee and ankle pain, recommend elevating your left leg above the level of your heart to help with swelling and inflammation.  You may take Tylenol/ibuprofen for pain.  Please do not hesitate to return if the worrisome signs and symptoms we discussed become apparent.

## 2023-12-17 NOTE — ED Notes (Signed)
 Pt provided a sandwich and sprite.
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
## 2023-12-17 NOTE — ED Notes (Signed)
 Reviewed D/C information with the patient, pt verbalized understanding. No additional concerns at this time.

## 2023-12-23 DIAGNOSIS — F1090 Alcohol use, unspecified, uncomplicated: Secondary | ICD-10-CM | POA: Diagnosis not present

## 2023-12-23 DIAGNOSIS — Z79899 Other long term (current) drug therapy: Secondary | ICD-10-CM | POA: Diagnosis not present

## 2023-12-23 DIAGNOSIS — S0990XA Unspecified injury of head, initial encounter: Secondary | ICD-10-CM | POA: Diagnosis present

## 2023-12-23 DIAGNOSIS — Z91040 Latex allergy status: Secondary | ICD-10-CM | POA: Insufficient documentation

## 2023-12-23 DIAGNOSIS — W230XXA Caught, crushed, jammed, or pinched between moving objects, initial encounter: Secondary | ICD-10-CM | POA: Insufficient documentation

## 2023-12-23 DIAGNOSIS — S0003XA Contusion of scalp, initial encounter: Secondary | ICD-10-CM | POA: Diagnosis not present
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
## 2023-12-24 ENCOUNTER — Emergency Department (HOSPITAL_COMMUNITY): Payer: MEDICAID

## 2023-12-24 ENCOUNTER — Encounter (HOSPITAL_COMMUNITY): Payer: Self-pay

## 2023-12-24 ENCOUNTER — Other Ambulatory Visit: Payer: Self-pay

## 2023-12-24 ENCOUNTER — Emergency Department (HOSPITAL_COMMUNITY)
Admission: EM | Admit: 2023-12-24 | Discharge: 2023-12-24 | Disposition: A | Discharge status: Home / Self Care | Payer: MEDICAID | Attending: Emergency Medicine | Admitting: Emergency Medicine

## 2023-12-24 DIAGNOSIS — S0003XA Contusion of scalp, initial encounter: Secondary | ICD-10-CM | POA: Diagnosis not present

## 2023-12-24 LAB — PREGNANCY, URINE: Preg Test, Ur: NEGATIVE

## 2023-12-24 MED ORDER — ACETAMINOPHEN 325 MG PO TABS
650.0000 mg | ORAL_TABLET | Freq: Once | ORAL | Status: AC
  Administered 2023-12-24: 650 mg via ORAL
  Filled 2023-12-24: qty 2
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
## 2023-12-26 ENCOUNTER — Other Ambulatory Visit: Payer: Self-pay

## 2023-12-26 ENCOUNTER — Emergency Department (HOSPITAL_COMMUNITY)
Admission: EM | Admit: 2023-12-26 | Discharge: 2023-12-26 | Disposition: A | Discharge status: Home / Self Care | Payer: MEDICAID | Attending: Emergency Medicine | Admitting: Emergency Medicine

## 2023-12-26 DIAGNOSIS — I10 Essential (primary) hypertension: Secondary | ICD-10-CM | POA: Diagnosis not present

## 2023-12-26 DIAGNOSIS — Z91040 Latex allergy status: Secondary | ICD-10-CM | POA: Diagnosis not present

## 2023-12-26 DIAGNOSIS — S0003XD Contusion of scalp, subsequent encounter: Secondary | ICD-10-CM | POA: Insufficient documentation

## 2023-12-26 DIAGNOSIS — S40021D Contusion of right upper arm, subsequent encounter: Secondary | ICD-10-CM | POA: Diagnosis not present

## 2023-12-26 DIAGNOSIS — Z79899 Other long term (current) drug therapy: Secondary | ICD-10-CM | POA: Insufficient documentation

## 2023-12-26 DIAGNOSIS — R519 Headache, unspecified: Secondary | ICD-10-CM | POA: Diagnosis present

## 2023-12-26 MED ORDER — ACETAMINOPHEN 500 MG PO TABS
1000.0000 mg | ORAL_TABLET | Freq: Once | ORAL | Status: AC
  Administered 2023-12-26: 1000 mg via ORAL
  Filled 2023-12-26: qty 2

## 2023-12-26 MED ORDER — NAPROXEN 250 MG PO TABS
375.0000 mg | ORAL_TABLET | Freq: Once | ORAL | Status: AC
  Administered 2023-12-26: 375 mg via ORAL
  Filled 2023-12-26: qty 2

## 2023-12-26 NOTE — Discharge Instructions (Addendum)
 For pain control you may take 1000 mg of acetaminophen (Tylenol) every 8 hours and/or 600 mg of Ibuprofen (Motrin, Advil, etc.) every 6-8 hours as needed.  Please limit acetaminophen (Tylenol) to 4000 mg and Ibuprofen (Motrin, Advil, etc.) to 2400 mg for a 24hr period. Please note that other over-the-counter medicine may contain acetaminophen or ibuprofen as a component of their ingredients.
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
## 2024-01-28 ENCOUNTER — Other Ambulatory Visit: Payer: Self-pay

## 2024-01-28 ENCOUNTER — Emergency Department (HOSPITAL_COMMUNITY)
Admission: EM | Admit: 2024-01-28 | Discharge: 2024-01-28 | Disposition: A | Discharge status: Home / Self Care | Attending: Emergency Medicine | Admitting: Emergency Medicine

## 2024-01-28 ENCOUNTER — Encounter (HOSPITAL_COMMUNITY): Payer: Self-pay

## 2024-01-28 DIAGNOSIS — Z91040 Latex allergy status: Secondary | ICD-10-CM | POA: Insufficient documentation

## 2024-01-28 DIAGNOSIS — Z5900 Homelessness unspecified: Secondary | ICD-10-CM | POA: Insufficient documentation

## 2024-01-28 DIAGNOSIS — I10 Essential (primary) hypertension: Secondary | ICD-10-CM | POA: Diagnosis not present

## 2024-01-28 DIAGNOSIS — R519 Headache, unspecified: Secondary | ICD-10-CM | POA: Insufficient documentation

## 2024-01-28 DIAGNOSIS — Z79899 Other long term (current) drug therapy: Secondary | ICD-10-CM | POA: Diagnosis not present

## 2024-01-28 DIAGNOSIS — M79603 Pain in arm, unspecified: Secondary | ICD-10-CM | POA: Insufficient documentation

## 2024-01-28 NOTE — ED Notes (Addendum)
 Pt was asked to move rooms, pt refused & got loud with staff saying she's not moving and she's in pain; provider & charge RN notified.
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
## 2024-01-28 NOTE — ED Triage Notes (Signed)
 Pt. Arrives for headache and arm pain. Denies vision changes, denies nausea and vomiting, denies SI/HI.

## 2024-02-20 ENCOUNTER — Other Ambulatory Visit: Payer: Self-pay

## 2024-02-20 ENCOUNTER — Encounter (HOSPITAL_COMMUNITY): Payer: Self-pay

## 2024-02-20 ENCOUNTER — Emergency Department (HOSPITAL_COMMUNITY)
Admission: EM | Admit: 2024-02-20 | Discharge: 2024-02-20 | Disposition: A | Discharge status: Home / Self Care | Attending: Emergency Medicine | Admitting: Emergency Medicine

## 2024-02-20 DIAGNOSIS — F17210 Nicotine dependence, cigarettes, uncomplicated: Secondary | ICD-10-CM | POA: Insufficient documentation

## 2024-02-20 DIAGNOSIS — Z5900 Homelessness unspecified: Secondary | ICD-10-CM | POA: Diagnosis not present

## 2024-02-20 DIAGNOSIS — R0981 Nasal congestion: Secondary | ICD-10-CM | POA: Diagnosis present

## 2024-02-20 DIAGNOSIS — I10 Essential (primary) hypertension: Secondary | ICD-10-CM | POA: Insufficient documentation

## 2024-02-20 LAB — URINALYSIS, ROUTINE W REFLEX MICROSCOPIC
Bilirubin Urine: NEGATIVE
Glucose, UA: NEGATIVE mg/dL
Hgb urine dipstick: NEGATIVE
Ketones, ur: NEGATIVE mg/dL
Leukocytes,Ua: NEGATIVE
Nitrite: NEGATIVE
Protein, ur: NEGATIVE mg/dL
Specific Gravity, Urine: 1.001 — ABNORMAL LOW (ref 1.005–1.030)
pH: 5 (ref 5.0–8.0)

## 2024-02-20 NOTE — ED Provider Notes (Signed)
 [facility]-EMERGENCY DEPT [facility] Emergency Department Provider Note MRN:  [id number]  Arrival date & time: 02/20/24     Chief Complaint   Nasal Congestion   History of Present Illness   Amy Conway is a 58 y.o. year-old female with a history of hypertension presenting to the ED with chief complaint of nasal congestion.  Nasal congestion for the past few days, also some burning with urination.  Review of Systems  A thorough review of systems was obtained and all systems are negative except as noted in the HPI and PMH.   Patient's Health History    Past Medical History:  Diagnosis Date   Alcohol abuse    Depression    GERD (gastroesophageal reflux disease)    Gout    Homelessness    Hypertension     Past Surgical History:  Procedure Laterality Date   CESAREAN SECTION  1997    History reviewed. No pertinent family history.  Social History   Socioeconomic History   Marital status: Legally Separated    Spouse name: Not on file   Number of children: Not on file   Years of education: Not on file   Highest education level: Not on file  Occupational History   Not on file  Tobacco Use   Smoking status: Every Day    Current packs/day: 0.50    Average packs/day: 0.5 packs/day for 29.0 years (14.5 ttl pk-yrs)    Types: Cigarettes   Smokeless tobacco: Never  Vaping Use   Vaping status: Never Used  Substance and Sexual Activity   Alcohol use: Not Currently   Drug use: No   Sexual activity: Yes    Birth control/protection: None  Other Topics Concern   Not on file  Social History Narrative   Not on file   Social Drivers of Health   Financial Resource Strain: Not on file  Food Insecurity: Not on file  Transportation Needs: Not on file  Physical Activity: Not on file  Stress: Not on file  Social Connections: Not on file  Intimate Partner Violence: Not on file     Physical Exam   Vitals:   02/20/24 0042 02/20/24 0043  BP: (!) 167/97   Pulse: 78    Resp: (!) 21   Temp:  98.9 F (37.2 C)  SpO2: 100%     CONSTITUTIONAL: Well-appearing, NAD NEURO/PSYCH: Resting comfortably, will wake up and answer questions but then go back to sleep.  Largely nonparticipatory EYES:  eyes equal and reactive ENT/NECK:  no LAD, no JVD CARDIO: Regular rate, well-perfused, normal S1 and S2 PULM:  CTAB no wheezing or rhonchi GI/GU:  non-distended, non-tender MSK/SPINE:  No gross deformities, no edema SKIN:  no rash, atraumatic   *Additional and/or pertinent findings included in MDM below  Diagnostic and Interventional Summary    EKG Interpretation Date/Time:    Ventricular Rate:    PR Interval:    QRS Duration:    QT Interval:    QTC Calculation:   R Axis:      Text Interpretation:         Labs Reviewed  URINALYSIS, ROUTINE W REFLEX MICROSCOPIC - Abnormal; Notable for the following components:      Result Value   Color, Urine COLORLESS (*)    Specific Gravity, Urine 1.001 (*)    All other components within normal limits    No orders to display    Medications - No data to display   Procedures  /  Critical  Care Procedures  ED Course and Medical Decision Making  Initial Impression and Ddx Suspicion for ER visit for shelter.  Does not seem very motivated to answer questions, seems to just want to get some rest.  Well-appearing with normal vitals, some report of dysuria, obtain a urinalysis.  Past medical/surgical history that increases complexity of ED encounter: Hypertension  Interpretation of Diagnostics I personally reviewed the Laboratory Testing and my interpretation is as follows: Urinalysis is normal    Patient Reassessment and Ultimate Disposition/Management     Discharge  Patient management required discussion with the following services or consulting groups:  None  Complexity of Problems Addressed Acute complicated illness or Injury  Additional Data Reviewed and Analyzed Further history obtained  from: None  Additional Factors Impacting ED Encounter Risk None  Merrick Abe. Harless Lien, MD [facility] Emergency Medicine [facility] [email or web address]  Final Clinical Impressions(s) / ED Diagnoses     ICD-10-CM   1. Nasal congestion  R09.81       ED Discharge Orders     None        Discharge Instructions Discussed with and Provided to Patient:     Discharge Instructions      You were evaluated in the Emergency Department and after careful evaluation, we did not find any emergent condition requiring admission or further testing in the hospital.  Your exam/testing today is overall reassuring.  Symptoms likely due to viral illness.  Urine sample did not show any signs of infection.  Recommend follow-up with your primary care doctor to discuss her symptoms.  Can use over-the-counter nasal decongestions as needed.  Please return to the Emergency Department if you experience any worsening of your condition.   Thank you for allowing us  to be a part of your care.      Edson Graces, MD 02/20/24 [phone]

## 2024-02-20 NOTE — Discharge Instructions (Signed)
 You were evaluated in the Emergency Department and after careful evaluation, we did not find any emergent condition requiring admission or further testing in the hospital.  Your exam/testing today is overall reassuring.  Symptoms likely due to viral illness.  Urine sample did not show any signs of infection.  Recommend follow-up with your primary care doctor to discuss her symptoms.  Can use over-the-counter nasal decongestions as needed.  Please return to the Emergency Department if you experience any worsening of your condition.   Thank you for allowing us  to be a part of your care.

## 2024-02-20 NOTE — ED Triage Notes (Addendum)
 Pt. Bib [facility] for congestion x2 weeks and a knot on her back. Also states that she feels like she has a uti. Endorses painful urination with a fowl smell. Also states that she would like to be tested for AIDS.

## 2024-09-23 ENCOUNTER — Telehealth: Admitting: Nurse Practitioner

## 2024-09-23 DIAGNOSIS — I10 Essential (primary) hypertension: Secondary | ICD-10-CM | POA: Diagnosis not present

## 2024-09-23 MED ORDER — HYDROCHLOROTHIAZIDE 25 MG PO TABS: 25.0000 mg | ORAL_TABLET | Freq: Every day | ORAL | 3 refills | Status: AC

## 2024-09-23 NOTE — Congregational Nurse Program (Signed)
" °  Dept: [phone]   Congregational Nurse Program Note  Date of Encounter: 09/23/2024  Past Medical History: Past Medical History:  Diagnosis Date   Alcohol abuse    Depression    GERD (gastroesophageal reflux disease)    Gout    Homelessness    Hypertension     Encounter Details:  Community Questionnaire - 09/23/24 1410       Questionnaire   Ask client: Do you give verbal consent for me to treat you today? Yes    Student Assistance N/A    Location Patient Served  [facility]    Encounter Setting CN site    Population Status Unhoused    Insurance Medicaid    Insurance/Financial Assistance Referral N/A    Medication Patient Medications Reviewed    Medical Provider First Time PCP Connection    Medical Referrals Made [facility]    Medical Appointment Completed [facility] PCP/Clinic    Screenings CN Performed (remember to also record results) Blood Pressure    CNP Interventions Hypertension Education;Case Management;Health Counseling;Navigate Healthcare System    ED Visit Averted Yes          Client requesting BP check. Client denies headache or any numbness, tingling, chest pain or other unusual symptoms. RN asked client if she takes any medications. Client states she ws prescribed meds, but doesn't take because they maker her feel funny. Client has med bottle, but no label. RN able to gap inc and med appears to be Metoprolol . Client took one tablet and RN rechecked BP 2 hours later. Client states after our initial encounter she had ten minutes of tingly sensation to right arm. Not present now. RN scheduled virtual [facility] appointment with Lauraine Kitty, NP. Client is to d/c Metoprolol  and begin hydrochlorothiazide  25mg  in morning. Client to have labs drawn and BP check 09/27/24 at 9:30. Vitals:   09/23/24 1210 09/23/24 1410  BP: (!) 174/96 (!) 170/102  Pulse: 75 65     "

## 2024-09-23 NOTE — Progress Notes (Signed)
 " Virtual Primary Care Telehealth Visit  Virtual Visit Consent  Amy Conway, you are scheduled for a virtual visit with a  provider today. Just as with appointments in the office, your consent must be obtained to participate. Your consent will be active for this visit and any virtual visit you may have with one of our providers in the next 365 days. If you have a MyChart account, a copy of this consent can be sent to you electronically.  By engaging in this virtual visit, you consent to the provision of healthcare and authorize for your insurance to be billed (if applicable) for the services provided during this visit. Depending on your insurance coverage, you may receive a charge related to this service.  I need to obtain your verbal consent now. Are you willing to proceed with your visit today? Amy Conway has provided verbal consent on 09/23/2024 for a virtual visit (via [employer]). Lauraine Kitty, FNP  Date: 09/23/2024 2:20 PM  Virtual Visit via Video Note   I, Lauraine Kitty, connected with  Amy Conway  ([id number], 1966-02-26) on 09/23/2024 at  2:20 PM EST by a video-enabled telemedicine application and verified that I am speaking with the correct person using two identifiers.  Telepresenter, Mitzie Breen present for entirety of visit to assist with video functionality and physical examination via [employer] device.   This is an initial visit to discuss the opportunity to become a primary care patient at [facility] The patient understands that if their medical background is complex, their case will be reviewed with the Medical Director, and if Virtual Primary Care is not the ideal location for their care, our team will help establish the patient with a primary care provider in the area.   If this is determined that this location is not the best option for the patient, in the future if the patient's medical condition changes we can re explore the option of this location serving as  their primary care location.  The patient understands that by becoming a primary care patient, this would be the location for their primary care, and if they chose to leave this location and seek primary care services at another location they will not be able to continue their relationship with this clinic.    Location: Patient: Virtual Visit Location Patient: VPC visit location: [facility] Provider: Virtual Visit Location Provider: Home Office   History of Present Illness: Amy Conway is a 59 y.o. who identifies as a female who was assigned female at birth, and is being seen today as a new patient with the Virtual Primary Care Group.   The patient presents to the Congregational Nurse at the [facility] with request for assistance with high blood pressure.  She has been taking Metoprolol  on and off for the past few years. Typically gets refills in the ED has not been consistent with a PCP.   Does not like Metoprolol  because she says after she takes it she feels weird  No record of tachycardia in the past on review of recorded vitals  She notes when her BP is higher she gets swelling in her legs more so in her left   Denies other complaints today   Medical history is significant for HTN, osteoarthritis of right glenohumeral joint, alcohol use    Did someone refer you for care here today? Congregational or [facility] Prior to today where were you receiving healthcare from? other primary care local  Risk for admission to hospital or  ED 75% If you didn't come here for care today would you have gone somewhere else? nowhere When was the last time the patient sought medical attention? 2-5 years   Do you have any prescriptions for controlled medications No     HPI:  Problems:  Patient Active Problem List   Diagnosis Date Noted   Syncope 05/24/2014   Alcohol abuse 04/01/2014   Viral URI 06/23/2013    Allergies: Allergies[1] Medications: Current  Medications[2]  Observations/Objective: Physical Exam Constitutional:      General: She is not in acute distress.    Appearance: Normal appearance.  HENT:     Right Ear: Tympanic membrane, ear canal and external ear normal.     Left Ear: Tympanic membrane, ear canal and external ear normal.     Nose: Nose normal.     Mouth/Throat:     Mouth: Mucous membranes are moist.  Cardiovascular:     Rate and Rhythm: Normal rate and regular rhythm.     Heart sounds: Normal heart sounds.  Pulmonary:     Effort: Pulmonary effort is normal.     Breath sounds: Normal breath sounds.  Musculoskeletal:     Cervical back: Neck supple.  Neurological:     Mental Status: She is alert and oriented to person, place, and time.  Psychiatric:        Mood and Affect: Mood normal.      Assessment and Plan:   1. Primary hypertension (Primary)   Stop Metoprolol  and start:  - hydrochlorothiazide  (HYDRODIURIL ) 25 MG tablet; Take 1 tablet (25 mg total) by mouth daily.  Dispense: 90 tablet; Refill: 3    Patient will plan to come to [facility] Monday for labs   Follow Up Instructions: I discussed the assessment and treatment plan with the patient. The Telepresenter provided patient with a physical copy of my written instructions for review.   The patient was advised to call back or seek an in-person evaluation if the symptoms worsen or if the condition fails to improve as anticipated.    Lauraine Kitty, FNP  **Disclaimer: This note may have been dictated with voice recognition software. Similar sounding words can inadvertently be transcribed and this note may contain transcription errors which may not have been corrected upon publication of note.**     [1]  Allergies Allergen Reactions   Bee Venom Swelling   Ibuprofen  Hives and Itching   Meat Extract Hives, Swelling and Other (See Comments)    HOT DOGS = Swelling and hives all over   Penicillins Hives and Itching    Has patient had a PCN reaction  causing immediate rash, facial/tongue/throat swelling, SOB or lightheadedness with hypotension: {Yes Has patient had a PCN reaction causing severe rash involving mucus membranes or skin necrosis: No Has patient had a PCN reaction that required hospitalization {Yes Has patient had a PCN reaction occurring within the last 10 years: {Yes If all of the above answers are NO, then may proceed with Cephalosporin use.   Latex Rash  [2]  Current Outpatient Medications:    acetaminophen  (TYLENOL ) 500 MG tablet, Take 2 tablets (1,000 mg total) by mouth every 6 (six) hours as needed for mild pain or moderate pain., Disp: 30 tablet, Rfl: 0   albuterol  (VENTOLIN  HFA) 108 (90 Base) MCG/ACT inhaler, Inhale 1-2 puffs into the lungs every 6 (six) hours as needed for wheezing or shortness of breath., Disp: 18 g, Rfl: 0   ALEVE  220 MG tablet, Take 220-440 mg by mouth  2 (two) times daily as needed (for headaches)., Disp: , Rfl:    cyclobenzaprine  (FLEXERIL ) 10 MG tablet, Take 1 tablet (10 mg total) by mouth 2 (two) times daily as needed for muscle spasms., Disp: 20 tablet, Rfl: 0   gabapentin  (NEURONTIN ) 100 MG capsule, Take 1 capsule (100 mg total) by mouth 3 (three) times daily., Disp: 90 capsule, Rfl: 0   metoprolol  tartrate (LOPRESSOR ) 50 MG tablet, Take 1 tablet (50 mg total) by mouth 2 (two) times daily., Disp: 60 tablet, Rfl: 0   metroNIDAZOLE  (FLAGYL ) 500 MG tablet, Take 1 tablet (500 mg total) by mouth 2 (two) times daily., Disp: 14 tablet, Rfl: 0   polyethylene glycol powder (GLYCOLAX /MIRALAX ) 17 GM/SCOOP powder, Take 17 g by mouth daily., Disp: 255 g, Rfl: 0  "

## 2024-09-27 ENCOUNTER — Ambulatory Visit: Admitting: Nurse Practitioner

## 2024-10-04 NOTE — Congregational Nurse Program (Signed)
" °  Dept: [phone]   Congregational Nurse Program Note  Date of Encounter: 10/04/2024  Past Medical History: Past Medical History:  Diagnosis Date   Alcohol abuse    Depression    GERD (gastroesophageal reflux disease)    Gout    Homelessness    Hypertension     Encounter Details:  Community Questionnaire - 10/04/24 1200       Questionnaire   Ask client: Do you give verbal consent for me to treat you today? Yes    Student Assistance N/A    Location Patient Served  [facility]    Encounter Setting CN site    Population Status Unhoused    Insurance Medicaid    Insurance/Financial Assistance Referral N/A    Medical Provider Yes    Medical Referrals Made [facility] or [facility]    Medical Appointment Completed [facility] P[facility]/Clinic    Screenings CN Performed (remember to also record results) Blood Pressure    CNP Interventions Hypertension Education;Case Management;Health Counseling;Navigate Healthcare System    ED Visit Averted Yes         RN met with the client to assess how she has been feeling since the last visit and to check her blood pressure. The client reported that she missed her lab appointment at [facility] due to her boyfriend being in the ICU in [place]. RN attempted to locate a contact number to reschedule the lab appointment but was unable to do so. RN will reach out to the NP tomorrow to obtain the correct contact information. RN was able to schedule a telehealth appointment with Lauraine Kitty, NP, for Wednesday to discuss the clients blood pressure. (Client unable to be seen tomorrow because she will be in [place]) Vitals:   10/04/24 1200  BP: (!) 160/90  Pulse: 76     "

## 2024-10-06 ENCOUNTER — Telehealth: Admitting: Nurse Practitioner

## 2024-10-06 VITALS — BP 175/94 | HR 69

## 2024-10-06 DIAGNOSIS — I10 Essential (primary) hypertension: Secondary | ICD-10-CM | POA: Diagnosis not present

## 2024-10-06 NOTE — Progress Notes (Signed)
 "  Acute Video Visit    Virtual Visit Consent:   Amy Conway, you are scheduled for a virtual visit with a [place] provider today.     Just as with appointments in the office, your consent must be obtained to participate.  Your consent will be active for this visit and any virtual visit you may have with one of our providers in the next 365 days.     If you have a MyChart account, a copy of this consent can be sent to you electronically.  All virtual visits are billed to your insurance company just like a traditional visit in the office.    If the connection with a video visit is poor, the visit may have to be switched to a telephone visit.  With either a video or telephone visit, we are not always able to ensure that we have a secure connection.     I need to obtain your verbal consent now.   Are you willing to proceed with your visit today?    Amy Conway has provided verbal consent on 10/06/2024 for a virtual visit (video or telephone).   Lauraine Kitty, FNP  Date: 10/06/2024 9:39 AM  Subjective:     Patient ID: Amy Conway, female    DOB: 04-21-1966, 59 y.o.   MRN: [id number]  Amy Conway Lauraine Kitty, connected with  Amy Conway  ([id number], 1965-12-04) on 10/06/24 at  9:20 AM EST by a video-enabled telemedicine application and verified that I am speaking with the correct person using two identifiers.   Location: Patient: [facility]  Provider: Virtual Visit Location Provider: Home Office   I discussed the limitations of evaluation and management by telemedicine and the availability of in person appointments. The patient expressed understanding and agreed to proceed.     HPI Amy Conway is a 59 y.o. who identifies as a female who was assigned female at birth, and is being seen today for follow up. She was started on hydrochlorothiazide  2 weeks ago, denies SE - feels swelling in her legs has improved   Missed appointment for labs   Denies other complaints today          Objective:    BP (!) 175/94   Pulse 69   LMP 10/15/2013  BP Readings from Last 3 Encounters:  10/06/24 (!) 175/94  10/04/24 (!) 160/90  09/23/24 (!) 170/102      Physical Exam Constitutional:      General: She is not in acute distress.    Appearance: Normal appearance. She is not ill-appearing.  HENT:     Nose: Nose normal.     Mouth/Throat:     Mouth: Mucous membranes are moist.  Pulmonary:     Effort: Pulmonary effort is normal.  Neurological:     Mental Status: She is alert and oriented to person, place, and time.  Psychiatric:        Mood and Affect: Mood normal.         Assessment & Plan:   Hypertension, unspecified type - Plan: CBC with Differential/Platelet, Hemoglobin A1c, Comprehensive metabolic panel with GFR, Lipid panel, TSH, VITAMIN D 25 Hydroxy (Vit-D Deficiency, Fractures), HIV Antibody (routine testing w rflx), Hepatitis C antibody   She will come to [facility] tomorrow for labs. Will follow up with results and consider starting Valsartan if kidney function is acceptable   CCN provided patient with BP cuff so she can continue to measure BP at home  Follow Up Instructions: I discussed the assessment and treatment plan with the patient. The patient was provided an opportunity to ask questions and all were answered. The patient agreed with the plan and demonstrated an understanding of the instructions.  A copy of instructions were sent to the patient via MyChart unless otherwise noted below.    The patient was advised to call back or seek an in-person evaluation if the symptoms worsen or if the condition fails to improve as anticipated.    Lauraine Kitty, FNP  **Disclaimer: This note may have been dictated with voice recognition software. Similar sounding words can inadvertently be transcribed and this note may contain transcription errors which may not have been corrected upon publication of note.** "

## 2024-10-06 NOTE — Congregational Nurse Program (Signed)
" °  Dept: [phone]   Congregational Nurse Program Note   Dept: [phone]   Congregational Nurse Program Note  Date of Encounter: 10/06/2024  Past Medical History: Past Medical History:  Diagnosis Date   Alcohol abuse    Depression    GERD (gastroesophageal reflux disease)    Gout    Homelessness    Hypertension     Encounter Details:  Community Questionnaire - 10/06/24 0930       Questionnaire   Ask client: Do you give verbal consent for me to treat you today? Yes    Student Assistance N/A    Location Patient Served  [facility]    Encounter Setting CN site    Population Status Unhoused    Insurance Medicaid    Insurance/Financial Assistance Referral N/A    Medication Patient Medications Reviewed    Medical Provider Yes    Medical Referrals Made [facility]    Medical Appointment Completed [facility] or Clinic    Screenings CN Performed (remember to also record results) Blood Pressure    CNP Interventions Hypertension Education;Case Management;Health Counseling;Navigate Healthcare System    ED Visit Averted Yes            Client to RN office for telehealth visit with Lauraine Kitty, NP. RN assisted with call. BP was 175/64 pulse 69. Client to have labs done tomorrow at [facility] at 9:00am. NP wants baseline labs prior to starting new medication for BP. Client states swelling has improved and is tolerating hydrochlorothiazide  well. RN able to provide spiritual and emotional support as boyfriend is in ICU after open heart surgery recently. Client denies any other needs at this time.   "

## 2024-10-07 ENCOUNTER — Other Ambulatory Visit: Admitting: Nurse Practitioner
